# Patient Record
Sex: Female | Born: 1940 | Race: White | Hispanic: No | Marital: Single | Smoking: Never smoker
Health system: Southern US, Community
[De-identification: ages and names within clinical notes are randomized; demographics above are authoritative.]

## PROBLEM LIST (undated history)

## (undated) DIAGNOSIS — R32 Unspecified urinary incontinence: Secondary | ICD-10-CM

## (undated) DIAGNOSIS — I10 Essential (primary) hypertension: Secondary | ICD-10-CM

## (undated) HISTORY — DX: Unspecified urinary incontinence: R32

## (undated) HISTORY — DX: Essential (primary) hypertension: I10

---

## 1972-04-24 HISTORY — PX: ABDOMINAL HYSTERECTOMY: SHX81

## 2005-05-02 ENCOUNTER — Ambulatory Visit: Payer: Self-pay | Admitting: Family Medicine

## 2005-05-26 ENCOUNTER — Ambulatory Visit: Payer: Self-pay | Admitting: Family Medicine

## 2005-12-05 ENCOUNTER — Ambulatory Visit: Payer: Self-pay | Admitting: Nurse Practitioner

## 2006-07-09 ENCOUNTER — Ambulatory Visit: Payer: Self-pay | Admitting: General Surgery

## 2015-08-27 ENCOUNTER — Telehealth: Payer: Self-pay | Admitting: Family Medicine

## 2015-08-27 ENCOUNTER — Ambulatory Visit
Admission: RE | Admit: 2015-08-27 | Discharge: 2015-08-27 | Disposition: A | Discharge status: Home / Self Care | Payer: Medicare Other | Source: Ambulatory Visit | Attending: Family Medicine | Admitting: Family Medicine

## 2015-08-27 ENCOUNTER — Ambulatory Visit (INDEPENDENT_AMBULATORY_CARE_PROVIDER_SITE_OTHER): Payer: Medicare Other | Admitting: Family Medicine

## 2015-08-27 ENCOUNTER — Encounter: Payer: Self-pay | Admitting: Family Medicine

## 2015-08-27 ENCOUNTER — Encounter (INDEPENDENT_AMBULATORY_CARE_PROVIDER_SITE_OTHER): Payer: Self-pay

## 2015-08-27 ENCOUNTER — Emergency Department
Admission: EM | Admit: 2015-08-27 | Discharge: 2015-08-27 | Disposition: A | Discharge status: Home / Self Care | Payer: Medicare Other | Source: Home / Self Care | Attending: Emergency Medicine | Admitting: Emergency Medicine

## 2015-08-27 VITALS — BP 158/82 | HR 70 | Temp 98.1°F | Ht 68.0 in | Wt 381.0 lb

## 2015-08-27 DIAGNOSIS — I82412 Acute embolism and thrombosis of left femoral vein: Secondary | ICD-10-CM | POA: Insufficient documentation

## 2015-08-27 DIAGNOSIS — M7989 Other specified soft tissue disorders: Secondary | ICD-10-CM

## 2015-08-27 DIAGNOSIS — R531 Weakness: Secondary | ICD-10-CM | POA: Diagnosis not present

## 2015-08-27 DIAGNOSIS — I82432 Acute embolism and thrombosis of left popliteal vein: Secondary | ICD-10-CM | POA: Diagnosis not present

## 2015-08-27 DIAGNOSIS — I82402 Acute embolism and thrombosis of unspecified deep veins of left lower extremity: Secondary | ICD-10-CM

## 2015-08-27 DIAGNOSIS — R29898 Other symptoms and signs involving the musculoskeletal system: Secondary | ICD-10-CM

## 2015-08-27 DIAGNOSIS — I10 Essential (primary) hypertension: Secondary | ICD-10-CM | POA: Diagnosis not present

## 2015-08-27 DIAGNOSIS — M4686 Other specified inflammatory spondylopathies, lumbar region: Secondary | ICD-10-CM | POA: Insufficient documentation

## 2015-08-27 DIAGNOSIS — Z86718 Personal history of other venous thrombosis and embolism: Secondary | ICD-10-CM | POA: Insufficient documentation

## 2015-08-27 DIAGNOSIS — M160 Bilateral primary osteoarthritis of hip: Secondary | ICD-10-CM | POA: Insufficient documentation

## 2015-08-27 LAB — COMPREHENSIVE METABOLIC PANEL
ALBUMIN: 3.8 g/dL (ref 3.5–5.2)
ALT: 14 U/L (ref 0–35)
AST: 13 U/L (ref 0–37)
Alkaline Phosphatase: 50 U/L (ref 39–117)
BUN: 15 mg/dL (ref 6–23)
CALCIUM: 9.5 mg/dL (ref 8.4–10.5)
CHLORIDE: 98 meq/L (ref 96–112)
CO2: 30 meq/L (ref 19–32)
Creatinine, Ser: 0.87 mg/dL (ref 0.40–1.20)
GFR: 67.27 mL/min (ref >60.00)
Glucose, Bld: 133 mg/dL — ABNORMAL HIGH (ref 70–99)
POTASSIUM: 4.3 meq/L (ref 3.5–5.1)
Sodium: 138 mEq/L (ref 135–145)
Total Bilirubin: 0.5 mg/dL (ref 0.2–1.2)
Total Protein: 7.3 g/dL (ref 6.0–8.3)

## 2015-08-27 LAB — CBC
HCT: 38.5 % (ref 36.0–46.0)
Hemoglobin: 12.7 g/dL (ref 12.0–15.0)
MCHC: 32.9 g/dL (ref 30.0–36.0)
MCV: 91.3 fl (ref 78.0–100.0)
Platelets: 329 10*3/uL (ref 150.0–400.0)
RBC: 4.21 Mil/uL (ref 3.87–5.11)
RDW: 14.4 % (ref 11.5–15.5)
WBC: 11.2 10*3/uL — AB (ref 4.0–10.5)

## 2015-08-27 LAB — TSH: TSH: 0.94 u[IU]/mL (ref 0.35–4.50)

## 2015-08-27 LAB — HEMOGLOBIN A1C: HEMOGLOBIN A1C: 6.1 % (ref 4.6–6.5)

## 2015-08-27 LAB — SEDIMENTATION RATE: SED RATE: 55 mm/h — AB (ref 0–22)

## 2015-08-27 LAB — VITAMIN B12: VITAMIN B 12: 165 pg/mL — AB (ref 211–911)

## 2015-08-27 MED ORDER — APIXABAN 5 MG PO TABS: 5.0000 mg | ORAL_TABLET | Freq: Two times a day (BID) | ORAL | Status: DC

## 2015-08-27 NOTE — Discharge Instructions (Signed)
Deep Vein Thrombosis °A deep vein thrombosis (DVT) is a blood clot (thrombus) that usually occurs in a deep, larger vein of the lower leg or the pelvis, or in an upper extremity such as the arm. These are dangerous and can lead to serious and even life-threatening complications if the clot travels to the lungs. °A DVT can damage the valves in your leg veins so that instead of flowing upward, the blood pools in the lower leg. This is called post-thrombotic syndrome, and it can result in pain, swelling, discoloration, and sores on the leg. °CAUSES °A DVT is caused by the formation of a blood clot in your leg, pelvis, or arm. Usually, several things contribute to the formation of blood clots. A clot may develop when: °· Your blood flow slows down. °· Your vein becomes damaged in some way. °· You have a condition that makes your blood clot more easily. °RISK FACTORS °A DVT is more likely to develop in: °· People who are older, especially over 60 years of age. °· People who are overweight (obese). °· People who sit or lie still for a long time, such as during long-distance travel (over 4 hours), bed rest, hospitalization, or during recovery from certain medical conditions like a stroke. °· People who do not engage in much physical activity (sedentary lifestyle). °· People who have chronic breathing disorders. °· People who have a personal or family history of blood clots or blood clotting disease. °· People who have peripheral vascular disease (PVD), diabetes, or some types of cancer. °· People who have heart disease, especially if the person had a recent heart attack or has congestive heart failure. °· People who have neurological diseases that affect the legs (leg paresis). °· People who have had a traumatic injury, such as breaking a hip or leg. °· People who have recently had major or lengthy surgery, especially on the hip, knee, or abdomen. °· People who have had a central line placed inside a large vein. °· People  who take medicines that contain the hormone estrogen. These include birth control pills and hormone replacement therapy. °· Pregnancy or during childbirth or the postpartum period. °· Long plane flights (over 8 hours). °SIGNS AND SYMPTOMS °Symptoms of a DVT can include:  °· Swelling of your leg or arm, especially if one side is much worse. °· Warmth and redness of your leg or arm, especially if one side is much worse. °· Pain in your arm or leg. If the clot is in your leg, symptoms may be more noticeable or worse when you stand or walk. °· A feeling of pins and needles, if the clot is in the arm. °The symptoms of a DVT that has traveled to the lungs (pulmonary embolism, PE) usually start suddenly and include: °· Shortness of breath while active or at rest. °· Coughing or coughing up blood or blood-tinged mucus. °· Chest pain that is often worse with deep breaths. °· Rapid or irregular heartbeat. °· Feeling light-headed or dizzy. °· Fainting. °· Feeling anxious. °· Sweating. °There may also be pain and swelling in a leg if that is where the blood clot started. °These symptoms may represent a serious problem that is an emergency. Do not wait to see if the symptoms will go away. Get medical help right away. Call your local emergency services (911 in the [place] Do not drive yourself to the hospital. °DIAGNOSIS °Your health care provider will take a medical history and perform a physical exam. You may also   have other tests, including: °· Blood tests to assess the clotting properties of your blood. °· Imaging tests, such as CT, ultrasound, MRI, X-ray, and other tests to see if you have clots anywhere in your body. °TREATMENT °After a DVT is identified, it can be treated. The type of treatment that you receive depends on many factors, such as the cause of your DVT, your risk for bleeding or developing more clots, and other medical conditions that you have. Sometimes, a combination of treatments is necessary. Treatment  options may be combined and include: °· Monitoring the blood clot with ultrasound. °· Taking medicines by mouth, such as newer blood thinners (anticoagulants), thrombolytics, or warfarin. °· Taking anticoagulant medicine by injection or through an IV tube. °· Wearing compression stockings or using different types of devices. °· Surgery (rare) to remove the blood clot or to place a filter in your abdomen to stop the blood clot from traveling to your lungs. °Treatments for a DVT are often divided into immediate treatment and long-term treatment (up to 3 months after DVT). You can work with your health care provider to choose the treatment program that is best for you. °HOME CARE INSTRUCTIONS °If you are taking a newer oral anticoagulant: °· Take the medicine every single day at the same time each day. °· Understand what foods and drugs interact with this medicine. °· Understand that there are no regular blood tests required when using this medicine. °· Understand the side effects of this medicine, including excessive bruising or bleeding. Ask your health care provider or pharmacist about other possible side effects. °If you are taking warfarin: °· Understand how to take warfarin and know which foods can affect how warfarin works in your body. °· Understand that it is dangerous to take too much or too little warfarin. Too much warfarin increases the risk of bleeding. Too little warfarin continues to allow the risk for blood clots. °· Follow your PT and INR blood testing schedule. The PT and INR results allow your health care provider to adjust your dose of warfarin. It is very important that you have your PT and INR tested as often as told by your health care provider. °· Avoid major changes in your diet, or tell your health care provider before you change your diet. Arrange a visit with a registered dietitian to answer your questions. Many foods, especially foods that are high in vitamin K, can interfere with warfarin  and affect the PT and INR results. Eat a consistent amount of foods that are high in vitamin K, such as: °¨ Spinach, kale, broccoli, cabbage, collard greens, turnip greens, Brussels sprouts, peas, cauliflower, seaweed, and parsley. °¨ Beef liver and pork liver. °¨ Green tea. °¨ Soybean oil. °· Tell your health care provider about any and all medicines, vitamins, and supplements that you take, including aspirin and other over-the-counter anti-inflammatory medicines. Be especially cautious with aspirin and anti-inflammatory medicines. Do not take those before you ask your health care provider if it is safe to do so. This is important because many medicines can interfere with warfarin and affect the PT and INR results. °· Do not start or stop taking any over-the-counter or prescription medicine unless your health care provider or pharmacist tells you to do so. °If you take warfarin, you will also need to do these things: °· Hold pressure over cuts for longer than usual. °· Tell your dentist and other health care providers that you are taking warfarin before you have any procedures in which   bleeding may occur. °· Avoid alcohol or drink very small amounts. Tell your health care provider if you change your alcohol intake. °· Do not use tobacco products, including cigarettes, chewing tobacco, and e-cigarettes. If you need help quitting, ask your health care provider. °· Avoid contact sports. °General Instructions °· Take over-the-counter and prescription medicines only as told by your health care provider. Anticoagulant medicines can have side effects, including easy bruising and difficulty stopping bleeding. If you are prescribed an anticoagulant, you will also need to do these things: °¨ Hold pressure over cuts for longer than usual. °¨ Tell your dentist and other health care providers that you are taking anticoagulants before you have any procedures in which bleeding may occur. °¨ Avoid contact sports. °· Wear a medical  alert bracelet or carry a medical alert card that says you have had a PE. °· Ask your health care provider how soon you can go back to your normal activities. Stay active to prevent new blood clots from forming. °· Make sure to exercise while traveling or when you have been sitting or standing for a long period of time. It is very important to exercise. Exercise your legs by walking or by tightening and relaxing your leg muscles often. Take frequent walks. °· Wear compression stockings as told by your health care provider to help prevent more blood clots from forming. °· Do not use tobacco products, including cigarettes, chewing tobacco, and e-cigarettes. If you need help quitting, ask your health care provider. °· Keep all follow-up appointments with your health care provider. This is important. °PREVENTION °Take these actions to decrease your risk of developing another DVT: °· Exercise regularly. For at least 30 minutes every day, engage in: °¨ Activity that involves moving your arms and legs. °¨ Activity that encourages good blood flow through your body by increasing your heart rate. °· Exercise your arms and legs every hour during long-distance travel (over 4 hours). Drink plenty of water and avoid drinking alcohol while traveling. °· Avoid sitting or lying in bed for long periods of time without moving your legs. °· Maintain a weight that is appropriate for your height. Ask your health care provider what weight is healthy for you. °· If you are a woman who is over 35 years of age, avoid unnecessary use of medicines that contain estrogen. These include birth control pills. °· Do not smoke, especially if you take estrogen medicines. If you need help quitting, ask your health care provider. °If you are hospitalized, prevention measures may include: °· Early walking after surgery, as soon as your health care provider says that it is safe. °· Receiving anticoagulants to prevent blood clots. If you cannot take  anticoagulants, other options may be available, such as wearing compression stockings or using different types of devices. °SEEK IMMEDIATE MEDICAL CARE IF: °· You have new or increased pain, swelling, or redness in an arm or leg. °· You have numbness or tingling in an arm or leg. °· You have shortness of breath while active or at rest. °· You have chest pain. °· You have a rapid or irregular heartbeat. °· You feel light-headed or dizzy. °· You cough up blood. °· You notice blood in your vomit, bowel movement, or urine. °These symptoms may represent a serious problem that is an emergency. Do not wait to see if the symptoms will go away. Get medical help right away. Call your local emergency services (911 in the [place] Do not drive yourself to the hospital. °  °  This information is not intended to replace advice given to you by your health care provider. Make sure you discuss any questions you have with your health care provider. °  °Document Released: 04/10/2005 Document Revised: 12/30/2014 Document Reviewed: 08/05/2014 °[employer]vier Interactive Patient Education ©2016 [employer] ° °

## 2015-08-27 NOTE — Telephone Encounter (Signed)
Just received after hours call from the service- regarding a venous doppler on this patient - positive for DVT femoral and popliteal veins  She is symptomatic  Adv she go to [facility] ED (she is already there) for further eval and tx Will cc her PCP

## 2015-08-27 NOTE — Assessment & Plan Note (Signed)
Patient reports subjective weakness in her legs. Difficulty walking though no other difficulty moving her legs. She is neurologically intact. No red flags. Suspect some level of deconditioning as she has been more inactive over the last several years. We'll obtain an x-ray of her lumbar spine to evaluate for pathology there. We'll obtain lab work as well to evaluate further. We will place a home PT referral. Given return precautions.

## 2015-08-27 NOTE — Assessment & Plan Note (Addendum)
Unilateral left leg swelling. Suspect venous insufficiency though with discrepancy in sizes between right and left legs will order an ultrasound of her left lower extremity to evaluate for DVT. If this reveals a DVT would favor Xarelto if her renal function will tolerate. CMP was checked today. Given return precautions.

## 2015-08-27 NOTE — Progress Notes (Signed)
Pre visit review using our clinic review tool, if applicable. No additional management support is needed unless otherwise documented below in the visit note. 

## 2015-08-27 NOTE — ED Provider Notes (Signed)
[facility] Emergency Department Provider Note  ____________________________________________    I have reviewed the triage vital signs and the nursing notes.   HISTORY  Chief Complaint DVT    HPI Kathleen Barton is a 75 y.o. female who was sent to the emergency department by her PCP for a confirmed DVT in the left leg. Patient reportedly noticed some increased swelling in her left leg over the last week. She admits she has a rather sedentary lifestyle. No injury to the area. Patient denies any shortness of breath. No pleurisy. No chest pain. Ultrasound confirmed DVT in left leg     Past Medical History  Diagnosis Date  . Hypertension   . Urinary incontinence     Patient Active Problem List   Diagnosis Date Noted  . Swelling of left lower extremity 08/27/2015  . Weakness of both lower extremities 08/27/2015  . Essential hypertension 08/27/2015    Past Surgical History  Procedure Laterality Date  . Abdominal hysterectomy  1974    Current Outpatient Rx  Name  Route  Sig  Dispense  Refill  . apixaban (ELIQUIS) 5 MG TABS tablet   Oral   Take 1 tablet (5 mg total) by mouth 2 (two) times daily. Take 10 mg (2 tablets) BID for 7 days,THEN 5 MG BID   84 tablet   0   . atenolol-chlorthalidone (TENORETIC) 50-25 MG tablet   Oral   Take 1 tablet by mouth daily. for blood pressure      6   . cholecalciferol (VITAMIN D) 1000 units tablet   Oral   Take 1,000 Units by mouth daily.         . meclizine (ANTIVERT) 25 MG tablet   Oral   Take 25 mg by mouth 3 (three) times daily as needed for dizziness.         Marland Kitchen PARoxetine (PAXIL) 30 MG tablet   Oral   Take 30 mg by mouth daily.      6     Allergies Review of patient's allergies indicates no known allergies.  Family History  Problem Relation Age of Onset  . Heart disease    . Hypertension      Social History Social History  Substance Use Topics  . Smoking status: Never Smoker   .  Smokeless tobacco: None  . Alcohol Use: No    Review of Systems  Constitutional: Negative for fever.   Cardiovascular: Negative for chest pain Respiratory: Negative for shortness of breath.No pleurisy Gastrointestinal: Negative for abdominal pain  Musculoskeletal: Positive for left calf pain Skin: Negative for rash. Neurological: Negative for headache Psychiatric: no anxiety    ____________________________________________   PHYSICAL EXAM:  VITAL SIGNS: ED Triage Vitals  Enc Vitals Group     BP 08/27/15 1758 162/58 mmHg     Pulse Rate 08/27/15 1758 84     Resp 08/27/15 1758 20     Temp 08/27/15 1758 98.2 F (36.8 C)     Temp Source 08/27/15 1758 Oral     SpO2 08/27/15 1758 92 %     Weight 08/27/15 1758 381 lb (172.82 kg)     Height 08/27/15 1758  (1.727 m)     Head Cir --      Peak Flow --      Pain Score 08/27/15 1759 0     Pain Loc --      Pain Edu? --      Excl. in GC? --  Constitutional: Alert and oriented. Well appearing and in no distress. Pleasant and interactive Eyes: Conjunctivae are normal. No erythema or injection ENT   Head: Normocephalic and atraumatic.   Mouth/Throat: Mucous membranes are moist. Cardiovascular: Normal rate, regular rhythm. Normal and symmetric distal pulses are present in the upper extremities. Respiratory: Normal respiratory effort without tachypnea nor retractions. Breath sounds are clear and equal bilaterally.   Genitourinary: deferred Musculoskeletal: Mild swelling to the left leg, no significant erythema. 2+ distal pulses. Neurologic:  Normal speech and language. No gross focal neurologic deficits are appreciated. Skin:  Skin is warm, dry and intact. No rash noted. Psychiatric: Mood and affect are normal. Patient exhibits appropriate insight and judgment.  ____________________________________________    LABS (pertinent positives/negatives)  Labs Reviewed - No data to  display  ____________________________________________   EKG  None  ____________________________________________    RADIOLOGY  None  ____________________________________________   PROCEDURES  Procedure(s) performed: none  Critical Care performed: none  ____________________________________________   INITIAL IMPRESSION / ASSESSMENT AND PLAN / ED COURSE  Pertinent labs & imaging results that were available during my care of the patient were reviewed by me and considered in my medical decision making (see chart for details).  Reviewed ultrasound result, positive left DVT. Patient without new shortness of breath or pleurisy. No hypoxia, monitor picking up erroneous readings due to patient movement, 99% while I was in the room holding her hand still. Discussed doing CT angio of the chest either in the emergency department or as an outpatient. Patient is impatient to leave and would prefer to do it as an outpatient if needed and given that the treatment would be the same I feel it is reasonable to discharge without CT.  I'll prescribe the patient eliquis. Discussed side effects at some length with the patient. She will follow-up with her PCP ____________________________________________   FINAL CLINICAL IMPRESSION(S) / ED DIAGNOSES  Final diagnoses:  DVT (deep venous thrombosis), left          Jene Everyobert Kellar Westberg, MD 08/27/15 2050

## 2015-08-27 NOTE — Telephone Encounter (Signed)
Called ultrasound to speak with technologist. Patient's ultrasound revealed DVT. Fairly extensive on imaging. Patient's labs have not returned. Ultrasound technologist for reports that she has spoken to the on-call physician and they recommended ED evaluation. I would agree with this given lack of resulted lab work and given patient's comorbidities. Discussed this with the patient's daughter-in-law. She is in agreement. Ultrasound technologist will take the patient to the emergency room.

## 2015-08-27 NOTE — ED Notes (Signed)
Pt sent MD [place] w/ confirmed DVT in L leg.  Pt noticed incr swelling in leg over last week.  Pt denies pain in leg.

## 2015-08-27 NOTE — Progress Notes (Addendum)
Patient ID: Kathleen Barton, female   DOB: Oct 31, 1940, 75 y.o.   MRN: [id number]  Tommi Rumps, MD Phone: [phone]  Kathleen Barton is a 75 y.o. female who presents today for new patient visit.  Patient presents with her daughter-in-law. Both of them contribute some of the history.  Leg weakness: Patient notes progressively over the last several months she's had issues with walking. She notes she has progressively gotten weaker. Worsening over the last several weeks. No numbness in her legs. She is able to move them while sitting. They've got the point where she can stand and pivot though is unable to do anything else. No back pain. No incontinence. No fevers. No saddle anesthesia. No history of cancer. Note she has not been very physically active over the last several years. Does not attempt to move around very frequently.  Left leg swelling: This has been going on for the last week or so. Left calf and lower leg swollen compared to the right. No pain. Her mobility is much less than previously. No history of blood clot.  HYPERTENSION Disease Monitoring Home BP Monitoring not checking recently Chest pain- no    Dyspnea- no Medications Compliance-  taking atenolol and chlorthalidone. Lightheadedness-  no  Edema- no   Active Ambulatory Problems    Diagnosis Date Noted  . Swelling of left lower extremity 08/27/2015  . Weakness of both lower extremities 08/27/2015  . Essential hypertension 08/27/2015   Resolved Ambulatory Problems    Diagnosis Date Noted  . No Resolved Ambulatory Problems   Past Medical History  Diagnosis Date  . Hypertension   . Urinary incontinence     Family History  Problem Relation Age of Onset  . Heart disease    . Hypertension      Social History   Social History  . Marital Status: Single    Spouse Name: N/A  . Number of Children: N/A  . Years of Education: N/A   Occupational History  . Not on file.   Social History Main Topics  . Smoking  status: Never Smoker   . Smokeless tobacco: Not on file  . Alcohol Use: No  . Drug Use: No  . Sexual Activity: Not on file   Other Topics Concern  . Not on file   Social History Narrative  . No narrative on file    ROS  General:  Negative for nexplained weight loss, fever Skin: Negative for new or changing mole, sore that won't heal HEENT: Negative for trouble hearing, trouble seeing, ringing in ears, mouth sores, hoarseness, change in voice, dysphagia. CV:  Negative for chest pain, dyspnea, edema, palpitations Resp: Negative for cough, dyspnea, hemoptysis GI: Negative for nausea, vomiting, diarrhea, constipation, abdominal pain, melena, hematochezia. GU: Negative for dysuria, incontinence, urinary hesitance, hematuria, vaginal or penile discharge, polyuria, sexual difficulty, lumps in testicle or breasts MSK: Negative for muscle cramps or aches, joint pain or swelling Neuro: Positive for weakness, Negative for headaches, numbness, dizziness, passing out/fainting Psych: Negative for depression, anxiety, memory problems  Objective  Physical Exam Filed Vitals:   08/27/15 1418  BP: 158/82  Pulse: 70  Temp: 98.1 F (36.7 C)    BP Readings from Last 3 Encounters:  08/27/15 158/82   Wt Readings from Last 3 Encounters:  08/27/15 381 lb (172.82 kg)    Physical Exam  Constitutional: No distress.  HENT:  Head: Normocephalic and atraumatic.  Right Ear: External ear normal.  Left Ear: External ear normal.  Mouth/Throat: Oropharynx  is clear and moist. No oropharyngeal exudate.  Eyes: Conjunctivae are normal. Pupils are equal, round, and reactive to light.  Cardiovascular: Normal rate, regular rhythm and normal heart sounds.   Pulmonary/Chest: Effort normal and breath sounds normal.  Abdominal: Soft. She exhibits no distension. There is no tenderness.  Musculoskeletal:  Left calf 50 cm, right calf 44 cm, 1+ pitting edema left lower extremity, no edema right lower extremity    Neurological: She is alert.  CN 2-12 intact, 5/5 strength in bilateral biceps, triceps, grip, quads, hamstrings, plantar and dorsiflexion, sensation to light touch intact in bilateral UE and LE, 2+ patellar reflexes  Skin: Skin is warm and dry. She is not diaphoretic.     Assessment/Plan:   Weakness of both lower extremities Patient reports subjective weakness in her legs. Difficulty walking though no other difficulty moving her legs. She is neurologically intact. No red flags. Suspect some level of deconditioning as she has been more inactive over the last several years. We'll obtain an x-ray of her lumbar spine to evaluate for pathology there. We'll obtain lab work as well to evaluate further. We will place a home PT referral. Given return precautions.  Swelling of left lower extremity Unilateral left leg swelling. Suspect venous insufficiency though with discrepancy in sizes between right and left legs will order an ultrasound of her left lower extremity to evaluate for DVT. If this reveals a DVT would favor Xarelto if her renal function will tolerate. CMP was checked today. Given return precautions.  Essential hypertension Elevated today. Patient and daughter-in-law report that it is typically elevated when she comes to the doctor's office and were normal at home. Discussed options of adding an additional blood pressure medication or monitoring and checking at home on a daily basis. They opted to monitor and check at home. They will check for the next week. If persistently greater than 150/90 we will consider additional medication.    Orders Placed This Encounter  Procedures  . US Venous Img Lower Unilateral Left    HOLD PATIENT WITH RESULTS    Standing Status: Future     Number of Occurrences: 1     Standing Expiration Date: 10/26/2016    Order Specific Question:  Reason for Exam (SYMPTOM  OR DIAGNOSIS REQUIRED)    Answer:  left lower extremity swelling for past week, 6 cm discrepancy  between legs    Order Specific Question:  Preferred imaging location?    Answer:  [facility]    Order Specific Question:  Call Results- Best Contact Number?    Answer:  [phone]  . DG Lumbar Spine Complete    Standing Status: Future     Number of Occurrences: 1     Standing Expiration Date: 10/26/2016    Order Specific Question:  Reason for Exam (SYMPTOM  OR DIAGNOSIS REQUIRED)    Answer:  subjective lower extermity weakness    Order Specific Question:  Preferred imaging location?    Answer:  [facility] ([facility])  . TSH  . Sed Rate (ESR)  . HgB A1c  . CBC  . B12  . Ambulatory referral to Home Health    Referral Priority:  Routine    Referral Type:  Home Health Care    Referral Reason:  Specialty Services Required    Requested Specialty:  [place]    Number of Visits Requested:  1    Meds ordered this encounter  Medications  . atenolol-chlorthalidone (TENORETIC) 50-25 MG  tablet    Sig: Take 1 tablet by mouth daily. for blood pressure    Refill:  6  . PARoxetine (PAXIL) 30 MG tablet    Sig: Take 30 mg by mouth daily.    Refill:  6  . meclizine (ANTIVERT) 25 MG tablet    Sig: Take 25 mg by mouth 3 (three) times daily as needed for dizziness.  . cholecalciferol (VITAMIN D) 1000 units tablet    Sig: Take 1,000 Units by mouth daily.     Tommi Rumps, MD [place]

## 2015-08-27 NOTE — Patient Instructions (Addendum)
Nice to meet you. Please [facility] the [facility] get the ultrasound of her leg. We'll obtain some lab work as well. You can also get an x-ray of your low back. We will refer you for physical therapy at home. Please monitor your blood pressure daily at home. If persistently greater than 150/90 please let us know. Please call to report her blood pressures in 1 week. If you develop numbness, weakness, loss of bowel or bladder function, chest pain, shortness of breath, or any new or changing symptoms please seek medical attention.

## 2015-08-27 NOTE — Assessment & Plan Note (Signed)
Elevated today. Patient and daughter-in-law report that it is typically elevated when she comes to the doctor's office and were normal at home. Discussed options of adding an additional blood pressure medication or monitoring and checking at home on a daily basis. They opted to monitor and check at home. They will check for the next week. If persistently greater than 150/90 we will consider additional medication.

## 2015-09-01 ENCOUNTER — Telehealth: Payer: Self-pay | Admitting: Family Medicine

## 2015-09-01 NOTE — Telephone Encounter (Signed)
[facility] called to request verbal order for the addition of OT and excercise of left leg. Gave ok

## 2015-09-08 ENCOUNTER — Telehealth: Payer: Self-pay | Admitting: Family Medicine

## 2015-09-08 NOTE — Telephone Encounter (Signed)
Please advise if you are okay with this, thanks

## 2015-09-08 NOTE — Telephone Encounter (Signed)
The patient's daughter called asking for the B-12 to be sent to the pharmacy and she would give the patient her injections. Her daughter is a Engineer, civil (consulting)nurse.

## 2015-09-09 ENCOUNTER — Telehealth: Payer: Self-pay | Admitting: *Deleted

## 2015-09-09 MED ORDER — "SYRINGE/NEEDLE (DISP) 25G X 1"" 3 ML MISC": Status: DC

## 2015-09-09 MED ORDER — CYANOCOBALAMIN 1000 MCG/ML IJ SOLN: 1000.0000 ug | INTRAMUSCULAR | Status: DC

## 2015-09-09 NOTE — Telephone Encounter (Signed)
[facility] has requested to have home health aid services 3x a week Contact Ester [phone]

## 2015-09-09 NOTE — Telephone Encounter (Signed)
Sent prescriptions in . thanks

## 2015-09-09 NOTE — Telephone Encounter (Signed)
This would be ok with me 

## 2015-09-09 NOTE — Telephone Encounter (Signed)
Attempted to call Ester back, not able to leave a message.

## 2015-09-14 ENCOUNTER — Telehealth: Payer: Self-pay | Admitting: *Deleted

## 2015-09-14 NOTE — Telephone Encounter (Signed)
FYI Kathleen Barton from [facility] call to report that patient fell from her recliner on 09/10/15. She slipped on the plastic chair cover, she did not have any injuries. [facility] corrected the cause of the fall.

## 2015-09-14 NOTE — Telephone Encounter (Signed)
Okay to give verbal order.

## 2015-09-14 NOTE — Telephone Encounter (Signed)
Kathleen [facility] from [facility], she is needing a home health aid verbal order for 3 times a week, a verbal was given by [employer]

## 2015-09-15 NOTE — Telephone Encounter (Signed)
Noted. Please check in with the patient to ensure she is doing well after this fall. We will discuss at her next follow-up as long as she is feeling well.

## 2015-09-16 NOTE — Telephone Encounter (Signed)
Spoke to patient and she is "fine".  She is able to continue with her exercises.

## 2015-09-17 ENCOUNTER — Telehealth: Payer: Self-pay | Admitting: *Deleted

## 2015-09-17 NOTE — Telephone Encounter (Signed)
Trey PaulaJeff from [facility] has requested verbal orders for Physical therapy: 2x a week for 3 weeks.  [phone]

## 2015-09-17 NOTE — Telephone Encounter (Signed)
Please advise for verbal order, thanks

## 2015-09-19 NOTE — Telephone Encounter (Signed)
Verbal order can be given.  

## 2015-09-21 ENCOUNTER — Other Ambulatory Visit: Payer: Self-pay | Admitting: Family Medicine

## 2015-09-21 DIAGNOSIS — R29898 Other symptoms and signs involving the musculoskeletal system: Secondary | ICD-10-CM

## 2015-09-21 NOTE — Telephone Encounter (Signed)
Spoke with Trey PaulaJeff and gave verbal order.

## 2015-09-22 ENCOUNTER — Other Ambulatory Visit: Payer: Self-pay | Admitting: Family Medicine

## 2015-09-22 MED ORDER — APIXABAN 5 MG PO TABS: 5.0000 mg | ORAL_TABLET | Freq: Two times a day (BID) | ORAL | Status: DC

## 2015-09-22 NOTE — Telephone Encounter (Signed)
Pt will run out of her apixaban (ELIQUIS) 5 MG TABS tablet before her appt on 10/08/15. Can she have a refill? Please call Amy Lennice Sitesope, daughter-in-law.

## 2015-09-22 NOTE — Telephone Encounter (Signed)
Please advise for refill has appt on 6/16. thanks

## 2015-09-22 NOTE — Telephone Encounter (Signed)
Refill given

## 2015-09-23 ENCOUNTER — Telehealth: Payer: Self-pay | Admitting: Family Medicine

## 2015-09-23 NOTE — Telephone Encounter (Signed)
Pt called stating that a PA is needed for medication apixaban (ELIQUIS) 5 MG TABS tablet. And refill.   Pt does have a scheduled appt.  Pharmacy [place] [facility] [place] [address]   Call pt @ [phone]. Thank you!

## 2015-09-23 NOTE — Telephone Encounter (Signed)
Looks like med was refilled yesterday, I have not received information that PA was needed.

## 2015-09-24 DIAGNOSIS — Z7901 Long term (current) use of anticoagulants: Secondary | ICD-10-CM | POA: Diagnosis not present

## 2015-09-24 DIAGNOSIS — R531 Weakness: Secondary | ICD-10-CM | POA: Diagnosis not present

## 2015-09-24 DIAGNOSIS — I10 Essential (primary) hypertension: Secondary | ICD-10-CM | POA: Diagnosis not present

## 2015-09-24 DIAGNOSIS — M7989 Other specified soft tissue disorders: Secondary | ICD-10-CM | POA: Diagnosis not present

## 2015-09-24 DIAGNOSIS — R32 Unspecified urinary incontinence: Secondary | ICD-10-CM | POA: Diagnosis not present

## 2015-09-24 NOTE — Telephone Encounter (Signed)
PA completed on Cover my meds , approved.  Spoke with the pharmacy and the patient to let them know it was approved. Thanks

## 2015-09-24 NOTE — Telephone Encounter (Signed)
Patient stated that apixaban will need a prior authorization. The pharmacy has the Rx, however they can not fill it without the prior authorization

## 2015-09-24 NOTE — Telephone Encounter (Signed)
Patient stated that the pharmacy has not receive Rx refill.

## 2015-09-28 DIAGNOSIS — M7989 Other specified soft tissue disorders: Secondary | ICD-10-CM | POA: Diagnosis not present

## 2015-09-28 DIAGNOSIS — Z7901 Long term (current) use of anticoagulants: Secondary | ICD-10-CM | POA: Diagnosis not present

## 2015-09-28 DIAGNOSIS — R531 Weakness: Secondary | ICD-10-CM | POA: Diagnosis not present

## 2015-09-28 DIAGNOSIS — R32 Unspecified urinary incontinence: Secondary | ICD-10-CM | POA: Diagnosis not present

## 2015-09-28 DIAGNOSIS — I10 Essential (primary) hypertension: Secondary | ICD-10-CM | POA: Diagnosis not present

## 2015-09-29 DIAGNOSIS — R531 Weakness: Secondary | ICD-10-CM | POA: Diagnosis not present

## 2015-09-29 DIAGNOSIS — Z7901 Long term (current) use of anticoagulants: Secondary | ICD-10-CM | POA: Diagnosis not present

## 2015-09-29 DIAGNOSIS — M7989 Other specified soft tissue disorders: Secondary | ICD-10-CM | POA: Diagnosis not present

## 2015-09-29 DIAGNOSIS — R32 Unspecified urinary incontinence: Secondary | ICD-10-CM | POA: Diagnosis not present

## 2015-09-29 DIAGNOSIS — I10 Essential (primary) hypertension: Secondary | ICD-10-CM | POA: Diagnosis not present

## 2015-09-30 DIAGNOSIS — I10 Essential (primary) hypertension: Secondary | ICD-10-CM | POA: Diagnosis not present

## 2015-09-30 DIAGNOSIS — Z7901 Long term (current) use of anticoagulants: Secondary | ICD-10-CM | POA: Diagnosis not present

## 2015-09-30 DIAGNOSIS — M7989 Other specified soft tissue disorders: Secondary | ICD-10-CM | POA: Diagnosis not present

## 2015-09-30 DIAGNOSIS — R32 Unspecified urinary incontinence: Secondary | ICD-10-CM | POA: Diagnosis not present

## 2015-09-30 DIAGNOSIS — R531 Weakness: Secondary | ICD-10-CM | POA: Diagnosis not present

## 2015-10-01 DIAGNOSIS — I10 Essential (primary) hypertension: Secondary | ICD-10-CM | POA: Diagnosis not present

## 2015-10-01 DIAGNOSIS — M7989 Other specified soft tissue disorders: Secondary | ICD-10-CM | POA: Diagnosis not present

## 2015-10-01 DIAGNOSIS — R531 Weakness: Secondary | ICD-10-CM | POA: Diagnosis not present

## 2015-10-01 DIAGNOSIS — Z7901 Long term (current) use of anticoagulants: Secondary | ICD-10-CM | POA: Diagnosis not present

## 2015-10-01 DIAGNOSIS — R32 Unspecified urinary incontinence: Secondary | ICD-10-CM | POA: Diagnosis not present

## 2015-10-05 DIAGNOSIS — I10 Essential (primary) hypertension: Secondary | ICD-10-CM | POA: Diagnosis not present

## 2015-10-05 DIAGNOSIS — R32 Unspecified urinary incontinence: Secondary | ICD-10-CM | POA: Diagnosis not present

## 2015-10-05 DIAGNOSIS — Z7901 Long term (current) use of anticoagulants: Secondary | ICD-10-CM | POA: Diagnosis not present

## 2015-10-05 DIAGNOSIS — M7989 Other specified soft tissue disorders: Secondary | ICD-10-CM | POA: Diagnosis not present

## 2015-10-05 DIAGNOSIS — R531 Weakness: Secondary | ICD-10-CM | POA: Diagnosis not present

## 2015-10-06 DIAGNOSIS — M7989 Other specified soft tissue disorders: Secondary | ICD-10-CM | POA: Diagnosis not present

## 2015-10-06 DIAGNOSIS — Z7901 Long term (current) use of anticoagulants: Secondary | ICD-10-CM | POA: Diagnosis not present

## 2015-10-06 DIAGNOSIS — R531 Weakness: Secondary | ICD-10-CM | POA: Diagnosis not present

## 2015-10-06 DIAGNOSIS — R32 Unspecified urinary incontinence: Secondary | ICD-10-CM | POA: Diagnosis not present

## 2015-10-06 DIAGNOSIS — I10 Essential (primary) hypertension: Secondary | ICD-10-CM | POA: Diagnosis not present

## 2015-10-07 ENCOUNTER — Telehealth: Payer: Self-pay | Admitting: *Deleted

## 2015-10-07 DIAGNOSIS — M7989 Other specified soft tissue disorders: Secondary | ICD-10-CM | POA: Diagnosis not present

## 2015-10-07 DIAGNOSIS — I10 Essential (primary) hypertension: Secondary | ICD-10-CM | POA: Diagnosis not present

## 2015-10-07 DIAGNOSIS — R531 Weakness: Secondary | ICD-10-CM | POA: Diagnosis not present

## 2015-10-07 DIAGNOSIS — Z7901 Long term (current) use of anticoagulants: Secondary | ICD-10-CM | POA: Diagnosis not present

## 2015-10-07 DIAGNOSIS — R32 Unspecified urinary incontinence: Secondary | ICD-10-CM | POA: Diagnosis not present

## 2015-10-07 NOTE — Telephone Encounter (Signed)
Kathleen Barton for [facility] has requested to have 4 more visits for plan of care. Darral Dashsther [phone] for verbal order

## 2015-10-07 NOTE — Telephone Encounter (Signed)
Spoke with Darral DashEsther and gave orders.

## 2015-10-08 ENCOUNTER — Ambulatory Visit: Payer: Medicare Other | Admitting: Family Medicine

## 2015-10-08 DIAGNOSIS — R32 Unspecified urinary incontinence: Secondary | ICD-10-CM | POA: Diagnosis not present

## 2015-10-08 DIAGNOSIS — I10 Essential (primary) hypertension: Secondary | ICD-10-CM | POA: Diagnosis not present

## 2015-10-08 DIAGNOSIS — Z7901 Long term (current) use of anticoagulants: Secondary | ICD-10-CM | POA: Diagnosis not present

## 2015-10-08 DIAGNOSIS — M7989 Other specified soft tissue disorders: Secondary | ICD-10-CM | POA: Diagnosis not present

## 2015-10-08 DIAGNOSIS — R531 Weakness: Secondary | ICD-10-CM | POA: Diagnosis not present

## 2015-10-11 DIAGNOSIS — R32 Unspecified urinary incontinence: Secondary | ICD-10-CM | POA: Diagnosis not present

## 2015-10-11 DIAGNOSIS — I10 Essential (primary) hypertension: Secondary | ICD-10-CM | POA: Diagnosis not present

## 2015-10-11 DIAGNOSIS — Z7901 Long term (current) use of anticoagulants: Secondary | ICD-10-CM | POA: Diagnosis not present

## 2015-10-11 DIAGNOSIS — R531 Weakness: Secondary | ICD-10-CM | POA: Diagnosis not present

## 2015-10-11 DIAGNOSIS — M7989 Other specified soft tissue disorders: Secondary | ICD-10-CM | POA: Diagnosis not present

## 2015-10-14 ENCOUNTER — Encounter: Payer: Self-pay | Admitting: Family Medicine

## 2015-10-14 ENCOUNTER — Ambulatory Visit (INDEPENDENT_AMBULATORY_CARE_PROVIDER_SITE_OTHER): Payer: Medicare Other | Admitting: Family Medicine

## 2015-10-14 VITALS — BP 118/72 | HR 69 | Temp 97.9°F | Ht 68.0 in

## 2015-10-14 DIAGNOSIS — E538 Deficiency of other specified B group vitamins: Secondary | ICD-10-CM | POA: Diagnosis not present

## 2015-10-14 DIAGNOSIS — R29898 Other symptoms and signs involving the musculoskeletal system: Secondary | ICD-10-CM | POA: Diagnosis not present

## 2015-10-14 DIAGNOSIS — I82412 Acute embolism and thrombosis of left femoral vein: Secondary | ICD-10-CM | POA: Diagnosis not present

## 2015-10-14 DIAGNOSIS — I10 Essential (primary) hypertension: Secondary | ICD-10-CM | POA: Diagnosis not present

## 2015-10-14 NOTE — Assessment & Plan Note (Signed)
At goal on recheck. Continue current medications. 

## 2015-10-14 NOTE — Assessment & Plan Note (Signed)
Patient reports completing 2 cycles of B12 injections at home. 2 more to be done and then B12 to be rechecked.

## 2015-10-14 NOTE — Patient Instructions (Signed)
Nice to see you. Please continue your Eliquis. Please continue your blood pressure medications. Please continue to do the physical therapy exercises at home. If you develop chest pain, shortness of breath, worsening depression, thoughts of harming herself or others, or any new or changing symptoms please seek medical attention.

## 2015-10-14 NOTE — Assessment & Plan Note (Signed)
Patient is significantly improved with regards this. She's been getting up and walking with physical therapy and home health. Walking with a walker now. She'll continue physical therapy exercises and continue to monitor.

## 2015-10-14 NOTE — Progress Notes (Signed)
Patient ID: Kathleen Barton, female   DOB: 02/17/1941, 75 y.o.   MRN: [id number]  Kathleen AlarEric Zarai Orsborn, MD Phone: [phone]  Kathleen Barton is a 75 y.o. female who presents today for follow-up.  DVT: Patient diagnosed with left lower extremity DVT at her last office visit. Started on Eliquis in the emergency room. She denies bleeding. She is taking Eliquis twice daily. No chest pain or shortness of breath. Swelling in her leg is significantly improved.  HYPERTENSION Disease Monitoring Home BP Monitoring checking with home health, typically 130s over 70s Chest pain- no    Dyspnea- no Medications Compliance-  taking atenolol and chlorthalidone.  Edema- no  Patient notes she is getting a bit better with regards to her lower extremity strength. She's begun walking with a walker. She has worked with home health physical therapy as well. She notes she is going to move beside her son and daughter-in-law to be closer to them. Got up and washed her own dishes today.  Depression: Notes she's been on Paxil for years. Feels depressed sometimes. Not at this moment. No SI.  PMH: nonsmoker.   ROS see history of present illness  Objective  Physical Exam Filed Vitals:   10/14/15 1438  BP: 118/72  Pulse: 69  Temp: 97.9 F (36.6 C)    BP Readings from Last 3 Encounters:  10/14/15 118/72  08/27/15 166/65  08/27/15 158/82   Wt Readings from Last 3 Encounters:  08/27/15 381 lb (172.82 kg)  08/27/15 381 lb (172.82 kg)    Physical Exam  Constitutional: No distress.  HENT:  Head: Normocephalic and atraumatic.  Cardiovascular: Normal rate, regular rhythm and normal heart sounds.   Pulmonary/Chest: Effort normal and breath sounds normal.  Musculoskeletal:  Bilateral lower extremities with no edema or swelling, no calf tenderness or cords  Neurological: She is alert.  5/5 strength in bilateral quads, hamstrings, plantar and dorsiflexion, sensation to light touch intact in bilateral UE and LE    Skin: Skin is warm and dry. She is not diaphoretic.  Psychiatric: Mood and affect normal.     Assessment/Plan: Please see individual problem list.  Essential hypertension At goal on recheck. Continue current medications.  DVT (deep venous thrombosis) (HCC) Patient with left lower extremity DVT currently on Eliquis. Tolerating medication. No chest pain or shortness of breath. Swelling is resolved. We'll continue Eliquis at this time. Event was likely related to immobility and level of mobility is unlikely to change significantly thus could likely benefit from lifelong prophylaxis. At this point we will favor 6 months of treatment and then reevaluate.  Weakness of both lower extremities Patient is significantly improved with regards this. She's been getting up and walking with physical therapy and home health. Walking with a walker now. She'll continue physical therapy exercises and continue to monitor.  B12 deficiency Patient reports completing 2 cycles of B12 injections at home. 2 more to be done and then B12 to be rechecked.  . Given return precautions.  Kathleen AlarEric Karlea Mckibbin, MD [facility] [place]

## 2015-10-14 NOTE — Progress Notes (Signed)
Pre visit review using our clinic review tool, if applicable. No additional management support is needed unless otherwise documented below in the visit note. 

## 2015-10-14 NOTE — Assessment & Plan Note (Signed)
Patient with left lower extremity DVT currently on Eliquis. Tolerating medication. No chest pain or shortness of breath. Swelling is resolved. We'll continue Eliquis at this time. Event was likely related to immobility and level of mobility is unlikely to change significantly thus could likely benefit from lifelong prophylaxis. At this point we will favor 6 months of treatment and then reevaluate.

## 2015-10-18 DIAGNOSIS — Z7901 Long term (current) use of anticoagulants: Secondary | ICD-10-CM | POA: Diagnosis not present

## 2015-10-18 DIAGNOSIS — R32 Unspecified urinary incontinence: Secondary | ICD-10-CM | POA: Diagnosis not present

## 2015-10-18 DIAGNOSIS — R531 Weakness: Secondary | ICD-10-CM | POA: Diagnosis not present

## 2015-10-18 DIAGNOSIS — M7989 Other specified soft tissue disorders: Secondary | ICD-10-CM | POA: Diagnosis not present

## 2015-10-18 DIAGNOSIS — I10 Essential (primary) hypertension: Secondary | ICD-10-CM | POA: Diagnosis not present

## 2015-10-22 DIAGNOSIS — M7989 Other specified soft tissue disorders: Secondary | ICD-10-CM | POA: Diagnosis not present

## 2015-10-22 DIAGNOSIS — Z7901 Long term (current) use of anticoagulants: Secondary | ICD-10-CM | POA: Diagnosis not present

## 2015-10-22 DIAGNOSIS — I10 Essential (primary) hypertension: Secondary | ICD-10-CM | POA: Diagnosis not present

## 2015-10-22 DIAGNOSIS — R531 Weakness: Secondary | ICD-10-CM | POA: Diagnosis not present

## 2015-10-22 DIAGNOSIS — R32 Unspecified urinary incontinence: Secondary | ICD-10-CM | POA: Diagnosis not present

## 2015-10-25 DIAGNOSIS — Z7901 Long term (current) use of anticoagulants: Secondary | ICD-10-CM | POA: Diagnosis not present

## 2015-10-25 DIAGNOSIS — R531 Weakness: Secondary | ICD-10-CM | POA: Diagnosis not present

## 2015-10-25 DIAGNOSIS — I10 Essential (primary) hypertension: Secondary | ICD-10-CM | POA: Diagnosis not present

## 2015-10-25 DIAGNOSIS — M7989 Other specified soft tissue disorders: Secondary | ICD-10-CM | POA: Diagnosis not present

## 2015-10-25 DIAGNOSIS — R32 Unspecified urinary incontinence: Secondary | ICD-10-CM | POA: Diagnosis not present

## 2015-11-15 ENCOUNTER — Encounter: Payer: Self-pay | Admitting: Family Medicine

## 2015-11-15 ENCOUNTER — Ambulatory Visit (INDEPENDENT_AMBULATORY_CARE_PROVIDER_SITE_OTHER): Payer: Medicare Other | Admitting: Family Medicine

## 2015-11-15 VITALS — BP 130/88 | HR 64 | Temp 98.6°F

## 2015-11-15 DIAGNOSIS — R29898 Other symptoms and signs involving the musculoskeletal system: Secondary | ICD-10-CM

## 2015-11-15 DIAGNOSIS — E538 Deficiency of other specified B group vitamins: Secondary | ICD-10-CM | POA: Diagnosis not present

## 2015-11-15 DIAGNOSIS — L853 Xerosis cutis: Secondary | ICD-10-CM | POA: Diagnosis not present

## 2015-11-15 DIAGNOSIS — I82412 Acute embolism and thrombosis of left femoral vein: Secondary | ICD-10-CM

## 2015-11-15 LAB — VITAMIN B12: Vitamin B-12: 924 pg/mL — ABNORMAL HIGH (ref 211–911)

## 2015-11-15 NOTE — Assessment & Plan Note (Signed)
Doing well. Tolerating Eliquis. Minimal bleeding from her gums. No other bleeding. She'll continue Eliquis for at least 6 months. She'll follow-up in 3 months.

## 2015-11-15 NOTE — Patient Instructions (Signed)
Nice to see you. Please continue to do the exercises at home and get up as you're able to. Nexium please continue the Eliquis. Please see a dentist. You can try Eucerin cream for your legs. We'll recheck her B12 today. If you develop numbness, weakness, chest pain, shortness of breath, bleeding, or any new or changing symptoms please seek medical attention.

## 2015-11-15 NOTE — Assessment & Plan Note (Signed)
Dry skin bilateral legs. Suggested Eucerin.

## 2015-11-15 NOTE — Assessment & Plan Note (Signed)
Recheck B12 today.   

## 2015-11-15 NOTE — Assessment & Plan Note (Signed)
Significantly improved. Continues to get better with getting up and walking around with walker. She'll continue physical therapy exercises and monitor.

## 2015-11-15 NOTE — Progress Notes (Signed)
  Marikay Alar, MD Phone: [phone]  Kathleen Barton is a 75 y.o. female who presents today for follow-up.  DVT: No swelling in her legs. No chest pain or shortness of breath. Notes she is taking the Eliquis daily. Minimal bleeding from her gums when she brushes her teeth occasionally. No dentures. Does irregularly floss though only brushes her teeth once a day. Has poor dentition. No bleeding from elsewhere.  Lower extremity weakness: Patient notes this is improved. She gets up and walks with her walker at home. Continuing to do physical therapy exercises at home.  Dry skin: Notes long history of dry skin on her legs. She tried multiple topical treatments. No improvement with Aveeno or [place]. Notes she had itching with one of these lotions. No rash.  B12 deficiency: Has been doing B12 shots at home. Needs recheck today. Feels well.  PMH: nonsmoker.   ROS see history of present illness  Objective  Physical Exam Vitals:   11/15/15 1322  BP: 130/88  Pulse: 64  Temp: 98.6 F (37 C)    BP Readings from Last 3 Encounters:  11/15/15 130/88  10/14/15 118/72  08/27/15 (!) 166/65   Wt Readings from Last 3 Encounters:  08/27/15 (!) 381 lb (172.8 kg)  08/27/15 (!) 381 lb (172.8 kg)    Physical Exam  Constitutional: No distress.  HENT:  Extremely poor dentition, no swelling of the gums or erythema  Cardiovascular: Normal rate, regular rhythm and normal heart sounds.   Pulmonary/Chest: Effort normal and breath sounds normal.  Musculoskeletal:  No swelling in bilateral lower extremities, bilateral calves with no cords or tenderness  Neurological: She is alert.  Skin: Skin is warm and dry. She is not diaphoretic.  Dry skin noted bilateral lower extremities     Assessment/Plan: Please see individual problem list.  DVT (deep venous thrombosis) (HCC) Doing well. Tolerating Eliquis. Minimal bleeding from her gums. No other bleeding. She'll continue Eliquis for at least 6  months. She'll follow-up in 3 months.  B12 deficiency Recheck B12 today.  Weakness of both lower extremities Significantly improved. Continues to get better with getting up and walking around with walker. She'll continue physical therapy exercises and monitor.  Dry skin Dry skin bilateral legs. Suggested Eucerin.   Orders Placed This Encounter  Procedures  . B12    No orders of the defined types were placed in this encounter.   Marikay Alar, MD [facility]

## 2015-11-16 ENCOUNTER — Other Ambulatory Visit: Payer: Self-pay | Admitting: Family Medicine

## 2015-11-16 NOTE — Telephone Encounter (Signed)
Pt needs refills on PARoxetine (PAXIL) 30 MG tablet and atenolol-chlorthalidone (TENORETIC) 50-25 MG tablet sent to [facility].. Pt states that the pharmacy has no refills on these.Marland Kitchen Please advise

## 2015-11-16 NOTE — Telephone Encounter (Signed)
Neither of these have been filled by Dr. Birdie Sons, Please advise for refills, thanks

## 2015-11-17 MED ORDER — PAROXETINE HCL 30 MG PO TABS: 30.0000 mg | ORAL_TABLET | Freq: Every day | ORAL | 1 refills | Status: DC

## 2015-11-17 MED ORDER — ATENOLOL-CHLORTHALIDONE 50-25 MG PO TABS: 1.0000 | ORAL_TABLET | Freq: Every day | ORAL | 3 refills | Status: DC

## 2015-11-17 NOTE — Telephone Encounter (Signed)
Refill given

## 2015-12-20 ENCOUNTER — Other Ambulatory Visit: Payer: Self-pay | Admitting: Family Medicine

## 2016-02-18 ENCOUNTER — Encounter: Payer: Self-pay | Admitting: Family Medicine

## 2016-02-18 ENCOUNTER — Ambulatory Visit (INDEPENDENT_AMBULATORY_CARE_PROVIDER_SITE_OTHER): Payer: Medicare Other | Admitting: Family Medicine

## 2016-02-18 VITALS — BP 138/86 | HR 81 | Temp 97.8°F | Wt 342.0 lb

## 2016-02-18 DIAGNOSIS — I10 Essential (primary) hypertension: Secondary | ICD-10-CM | POA: Diagnosis not present

## 2016-02-18 DIAGNOSIS — E66813 Obesity, class 3: Secondary | ICD-10-CM | POA: Insufficient documentation

## 2016-02-18 DIAGNOSIS — I82412 Acute embolism and thrombosis of left femoral vein: Secondary | ICD-10-CM

## 2016-02-18 MED ORDER — APIXABAN 5 MG PO TABS: 5.0000 mg | ORAL_TABLET | Freq: Two times a day (BID) | ORAL | 2 refills | Status: DC

## 2016-02-18 NOTE — Progress Notes (Signed)
Pre visit review using our clinic review tool, if applicable. No additional management support is needed unless otherwise documented below in the visit note. 

## 2016-02-18 NOTE — Assessment & Plan Note (Signed)
Doing well with this. No evidence of recurrence. She'll continue Eliquis. Did discuss at some point we will have a discussion of whether or not to continue this. She had an unprovoked DVT potentially related to inactivity. Advised that if her activity levels are not going to change she would likely need lifelong Eliquis. She is given return precautions.

## 2016-02-18 NOTE — Assessment & Plan Note (Signed)
At goal. Continue current medications. 

## 2016-02-18 NOTE — Patient Instructions (Signed)
Nice to see you. Congratulations on losing 40 pounds. Please keep up with the diet. Please start exercising by walking more. Please keep your dental appointment.  Please take your Eliquis twice daily. We will check some lab work and call with results. If you develop chest pain, shortness of breath, swelling in her legs, pain in her legs, or any new or change in terms please seek medical attention immediately.

## 2016-02-18 NOTE — Assessment & Plan Note (Signed)
Weight is down 39 pounds. Encouraged continued dietary changes. Encouraged exercise.

## 2016-02-18 NOTE — Progress Notes (Signed)
  Kathleen AlarEric Mekenzie Modeste, MD Phone: [phone]  Kathleen Barton is a 75 y.o. female who presents today for follow-up.  DVT: Patient is doing well with regards to this. Taking Eliquis. No issues with bleeding. No swelling or pain in her leg. No chest pain or shortness of breath.  Obesity: Patient is down 39 pounds since her last office visit. She's been changing her diet and eating healthier. Her daughter-in-law's trying to control her diet some. Eating a protein and mostly vegetables at dinner. Not snacking as much. Has not started exercise very much.  She sees a Education officer, communitydentist in January for her poor dentition.  HYPERTENSION  Disease Monitoring  Home BP Monitoring daughter-in-law reports her blood pressures are typically in the normal range Chest pain- no    Dyspnea- no Medications  Compliance-  taking atenolol, chlorthalidone.  Edema- no   PMH: nonsmoker.   ROS see history of present illness  Objective  Physical Exam Vitals:   02/18/16 1341  BP: 138/86  Pulse: 81  Temp: 97.8 F (36.6 C)    BP Readings from Last 3 Encounters:  02/18/16 138/86  11/15/15 130/88  10/14/15 118/72   Wt Readings from Last 3 Encounters:  02/18/16 (!) 342 lb (155.1 kg)  08/27/15 (!) 381 lb (172.8 kg)  08/27/15 (!) 381 lb (172.8 kg)    Physical Exam  Constitutional: No distress.  Cardiovascular: Normal rate, regular rhythm and normal heart sounds.   Pulmonary/Chest: Effort normal and breath sounds normal.  Musculoskeletal:  Bilateral lower extremities with no swelling, calf tenderness, or cords  Neurological: She is alert.  Skin: Skin is warm and dry. She is not diaphoretic.     Assessment/Plan: Please see individual problem list.  DVT (deep venous thrombosis) (HCC) Doing well with this. No evidence of recurrence. She'll continue Eliquis. Did discuss at some point we will have a discussion of whether or not to continue this. She had an unprovoked DVT potentially related to inactivity. Advised  that if her activity levels are not going to change she would likely need lifelong Eliquis. She is given return precautions.  Essential hypertension At goal. Continue current medications.  Morbid obesity (HCC) Weight is down 39 pounds. Encouraged continued dietary changes. Encouraged exercise.  She will keep her appointment with her dentist.  Orders Placed This Encounter  Procedures  . Basic metabolic panel    Meds ordered this encounter  Medications  . apixaban (ELIQUIS) 5 MG TABS tablet    Sig: Take 1 tablet (5 mg total) by mouth 2 (two) times daily.    Dispense:  60 tablet    Refill:  2    This prescription was filled on 12/20/2015. Any refills authorized will be placed on file.    Kathleen AlarEric Kathleen Arrick, MD [facility]

## 2016-02-18 NOTE — Addendum Note (Signed)
Addended by: Felix AhmadiFRANSEN, Daje Stark A on: 02/18/2016 02:21 PM   Modules accepted: Orders

## 2016-04-12 ENCOUNTER — Telehealth: Payer: Self-pay | Admitting: *Deleted

## 2016-04-12 NOTE — Telephone Encounter (Signed)
Have you seen a Prior authorization on this ?

## 2016-04-12 NOTE — Telephone Encounter (Signed)
[facility]

## 2016-04-13 NOTE — Telephone Encounter (Signed)
Completed PA this am. thanks

## 2016-04-13 NOTE — Telephone Encounter (Signed)
Have you seen prior authorization on this?

## 2016-04-13 NOTE — Telephone Encounter (Signed)
Pt called and stated that she needs a PA for apixaban (ELIQUIS) 5 MG TABS tablet. She only has a weeks worth of medication left. Can we please call once we have completed this. Thank you!

## 2016-04-13 NOTE — Telephone Encounter (Signed)
See below , thanks

## 2016-04-13 NOTE — Telephone Encounter (Signed)
I have not seen a prior auth for this. Please contact her pharmacy to check on this. Thanks.

## 2016-04-13 NOTE — Telephone Encounter (Signed)
Patient notified prior authorization in process

## 2016-04-13 NOTE — Telephone Encounter (Signed)
This is sonnenberg's patient

## 2016-04-14 NOTE — Telephone Encounter (Signed)
PA approved till 10/12/2016

## 2016-05-01 ENCOUNTER — Telehealth: Payer: Self-pay | Admitting: Family Medicine

## 2016-05-01 NOTE — Telephone Encounter (Signed)
Filed for prior authorization for Eliquis , patient insurance may deny due to patient has not tried coumadin or Xarelto which patient insurance will cover. FYI

## 2016-05-01 NOTE — Telephone Encounter (Signed)
Noted. Could consider Xarelto if prior authorization is not improved.

## 2016-05-09 ENCOUNTER — Other Ambulatory Visit: Payer: Self-pay | Admitting: Family Medicine

## 2016-05-26 ENCOUNTER — Ambulatory Visit: Payer: Medicare Other | Admitting: Family Medicine

## 2016-06-02 ENCOUNTER — Ambulatory Visit: Payer: Medicare Other | Admitting: Family Medicine

## 2016-07-06 ENCOUNTER — Other Ambulatory Visit: Payer: Self-pay | Admitting: Family Medicine

## 2016-07-14 ENCOUNTER — Ambulatory Visit (INDEPENDENT_AMBULATORY_CARE_PROVIDER_SITE_OTHER): Payer: Medicare Other | Admitting: Family Medicine

## 2016-07-14 ENCOUNTER — Encounter: Payer: Self-pay | Admitting: Family Medicine

## 2016-07-14 ENCOUNTER — Ambulatory Visit: Payer: Medicare Other | Admitting: Family Medicine

## 2016-07-14 DIAGNOSIS — E538 Deficiency of other specified B group vitamins: Secondary | ICD-10-CM | POA: Diagnosis not present

## 2016-07-14 DIAGNOSIS — Z1231 Encounter for screening mammogram for malignant neoplasm of breast: Secondary | ICD-10-CM

## 2016-07-14 DIAGNOSIS — I10 Essential (primary) hypertension: Secondary | ICD-10-CM

## 2016-07-14 DIAGNOSIS — Z1239 Encounter for other screening for malignant neoplasm of breast: Secondary | ICD-10-CM

## 2016-07-14 DIAGNOSIS — Z86718 Personal history of other venous thrombosis and embolism: Secondary | ICD-10-CM

## 2016-07-14 LAB — CBC
HEMATOCRIT: 40.5 % (ref 36.0–46.0)
HEMOGLOBIN: 13.3 g/dL (ref 12.0–15.0)
MCHC: 32.7 g/dL (ref 30.0–36.0)
MCV: 92.7 fl (ref 78.0–100.0)
Platelets: 241 10*3/uL (ref 150.0–400.0)
RBC: 4.38 Mil/uL (ref 3.87–5.11)
RDW: 13.5 % (ref 11.5–15.5)
WBC: 13 10*3/uL — AB (ref 4.0–10.5)

## 2016-07-14 LAB — COMPREHENSIVE METABOLIC PANEL
ALT: 11 U/L (ref 0–35)
AST: 8 U/L (ref 0–37)
Albumin: 3.8 g/dL (ref 3.5–5.2)
Alkaline Phosphatase: 58 U/L (ref 39–117)
BUN: 29 mg/dL — ABNORMAL HIGH (ref 6–23)
CALCIUM: 9.8 mg/dL (ref 8.4–10.5)
CHLORIDE: 99 meq/L (ref 96–112)
CO2: 33 meq/L — AB (ref 19–32)
CREATININE: 1 mg/dL (ref 0.40–1.20)
GFR: 57.15 mL/min — ABNORMAL LOW (ref >60.00)
Glucose, Bld: 148 mg/dL — ABNORMAL HIGH (ref 70–99)
Potassium: 3.9 mEq/L (ref 3.5–5.1)
Sodium: 140 mEq/L (ref 135–145)
Total Bilirubin: 0.4 mg/dL (ref 0.2–1.2)
Total Protein: 7.7 g/dL (ref 6.0–8.3)

## 2016-07-14 LAB — VITAMIN B12: Vitamin B-12: 251 pg/mL (ref 211–911)

## 2016-07-14 LAB — HEMOGLOBIN A1C: Hgb A1c MFr Bld: 6.1 % (ref 4.6–6.5)

## 2016-07-14 NOTE — Assessment & Plan Note (Signed)
Doing well. No evidence of recurrence. I had a discussion with her and her daughter-in-law regarding her Eliquis. She's not had any bleeding. Discussed that her DVT was likely a result of her inactivity and her obesity. These things are unlikely to change. I believe the benefit of the Eliquis outweighs the risk of it at this point. She agreed and we will continue this medication. Advised of bleeding precautions. We will check a CBC as well.

## 2016-07-14 NOTE — Progress Notes (Signed)
  Tommi Rumps, MD Phone: [phone]  Kathleen Barton is a 76 y.o. female who presents today for follow-up.  Hypertension: Not checking blood pressure. She is on atenolol and chlorthalidone. The chest pain, shortness breath, or edema.  DVT: She has a history of this about 10 months ago. She's been taking Eliquis. She notes no chest pain or swelling. No bleeding issues.  Morbid obesity: Has lost some weight by decreasing the amount of junk food she eats. She reports she is down about 11 pounds. She is getting up and moving more though not to any significant degree. Her daughter-in-law is helping significantly with this.  PMH: nonsmoker.   ROS see history of present illness  Objective  Physical Exam Vitals:   07/14/16 1356  BP: 128/82  Pulse: 86  Temp: 98.1 F (36.7 C)    BP Readings from Last 3 Encounters:  07/14/16 128/82  02/18/16 138/86  11/15/15 130/88   Wt Readings from Last 3 Encounters:  07/14/16 (!) 331 lb (150.1 kg)  02/18/16 (!) 342 lb (155.1 kg)  08/27/15 (!) 381 lb (172.8 kg)    Physical Exam  Constitutional: No distress.  Cardiovascular: Normal rate, regular rhythm and normal heart sounds.   Pulmonary/Chest: Effort normal and breath sounds normal.  Musculoskeletal: She exhibits no edema.  No lower external swelling or calf tenderness or cords  Neurological: She is alert.  Skin: Skin is warm and dry. She is not diaphoretic.     Assessment/Plan: Please see individual problem list.  Essential hypertension At goal. Continue current medications. Check CMP.  History of DVT of lower extremity Doing well. No evidence of recurrence. I had a discussion with her and her daughter-in-law regarding her Eliquis. She's not had any bleeding. Discussed that her DVT was likely a result of her inactivity and her obesity. These things are unlikely to change. I believe the benefit of the Eliquis outweighs the risk of it at this point. She agreed and we will continue  this medication. Advised of bleeding precautions. We will check a CBC as well.  Morbid obesity ([place]) Weight is trending down. Advise continued diet diet changes and trying to be more active.  B12 deficiency Recheck B12.   Orders Placed This Encounter  Procedures  . MM SCREENING BREAST TOMO BILATERAL    Standing Status:   Future    Standing Expiration Date:   09/13/2017    Order Specific Question:   Reason for Exam (SYMPTOM  OR DIAGNOSIS REQUIRED)    Answer:   breast cancer screening    Order Specific Question:   Preferred imaging location?    Answer:   [facility]  . HgB A1c  . Comp Met (CMET)  . B12  . CBC   Tommi Rumps, MD [place]

## 2016-07-14 NOTE — Progress Notes (Signed)
Pre visit review using our clinic review tool, if applicable. No additional management support is needed unless otherwise documented below in the visit note. 

## 2016-07-14 NOTE — Patient Instructions (Signed)
Nice to see you. Please continue to stay active. We will check lab work today and contact her with results.

## 2016-07-14 NOTE — Assessment & Plan Note (Signed)
At goal. Continue current medications. Check CMP. 

## 2016-07-14 NOTE — Assessment & Plan Note (Addendum)
Weight is trending down. Advise continued diet diet changes and trying to be more active.

## 2016-07-14 NOTE — Assessment & Plan Note (Signed)
Recheck B12 

## 2016-07-25 ENCOUNTER — Other Ambulatory Visit: Payer: Self-pay | Admitting: Family Medicine

## 2016-07-25 DIAGNOSIS — D72829 Elevated white blood cell count, unspecified: Secondary | ICD-10-CM

## 2016-07-27 ENCOUNTER — Telehealth: Payer: Self-pay | Admitting: Family Medicine

## 2016-07-27 NOTE — Telephone Encounter (Signed)
Pt will call back to schedule AWV °

## 2016-08-11 ENCOUNTER — Other Ambulatory Visit: Payer: Medicare Other

## 2016-08-18 ENCOUNTER — Other Ambulatory Visit (INDEPENDENT_AMBULATORY_CARE_PROVIDER_SITE_OTHER): Payer: Medicare Other

## 2016-08-18 DIAGNOSIS — D72829 Elevated white blood cell count, unspecified: Secondary | ICD-10-CM | POA: Diagnosis not present

## 2016-08-18 LAB — CBC WITH DIFFERENTIAL/PLATELET
BASOS ABS: 0.1 10*3/uL (ref 0.0–0.1)
Basophils Relative: 0.9 % (ref 0.0–3.0)
EOS PCT: 1.9 % (ref 0.0–5.0)
Eosinophils Absolute: 0.2 10*3/uL (ref 0.0–0.7)
HEMATOCRIT: 41.9 % (ref 36.0–46.0)
Hemoglobin: 13.6 g/dL (ref 12.0–15.0)
LYMPHS PCT: 28.2 % (ref 12.0–46.0)
Lymphs Abs: 3.1 10*3/uL (ref 0.7–4.0)
MCHC: 32.5 g/dL (ref 30.0–36.0)
MCV: 94.4 fl (ref 78.0–100.0)
MONOS PCT: 8.8 % (ref 3.0–12.0)
Monocytes Absolute: 1 10*3/uL (ref 0.1–1.0)
NEUTROS ABS: 6.6 10*3/uL (ref 1.4–7.7)
Neutrophils Relative %: 60.2 % (ref 43.0–77.0)
PLATELETS: 271 10*3/uL (ref 150.0–400.0)
RBC: 4.44 Mil/uL (ref 3.87–5.11)
RDW: 14.5 % (ref 11.5–15.5)
WBC: 11 10*3/uL — ABNORMAL HIGH (ref 4.0–10.5)

## 2016-08-21 ENCOUNTER — Other Ambulatory Visit: Payer: Self-pay | Admitting: Family Medicine

## 2016-08-21 DIAGNOSIS — D72829 Elevated white blood cell count, unspecified: Secondary | ICD-10-CM

## 2016-08-25 NOTE — Telephone Encounter (Signed)
Scheduled 09/08/16 °

## 2016-09-08 ENCOUNTER — Other Ambulatory Visit: Payer: Medicare Other

## 2016-09-08 ENCOUNTER — Ambulatory Visit (INDEPENDENT_AMBULATORY_CARE_PROVIDER_SITE_OTHER): Payer: Medicare Other

## 2016-09-08 VITALS — BP 126/78 | HR 83 | Temp 98.3°F | Resp 16 | Ht 68.0 in | Wt 330.8 lb

## 2016-09-08 DIAGNOSIS — Z23 Encounter for immunization: Secondary | ICD-10-CM

## 2016-09-08 DIAGNOSIS — E2839 Other primary ovarian failure: Secondary | ICD-10-CM | POA: Diagnosis not present

## 2016-09-08 DIAGNOSIS — Z0000 Encounter for general adult medical examination without abnormal findings: Secondary | ICD-10-CM

## 2016-09-08 DIAGNOSIS — D72829 Elevated white blood cell count, unspecified: Secondary | ICD-10-CM | POA: Diagnosis not present

## 2016-09-08 LAB — CBC WITH DIFFERENTIAL/PLATELET
Basophils Absolute: 104 cells/uL (ref 0–200)
Basophils Relative: 1 %
EOS PCT: 2 %
Eosinophils Absolute: 208 cells/uL (ref 15–500)
HCT: 40.2 % (ref 35.0–45.0)
HEMOGLOBIN: 12.9 g/dL (ref 11.7–15.5)
LYMPHS ABS: 1976 {cells}/uL (ref 850–3900)
LYMPHS PCT: 19 %
MCH: 30.1 pg (ref 27.0–33.0)
MCHC: 32.1 g/dL (ref 32.0–36.0)
MCV: 93.9 fL (ref 80.0–100.0)
MONOS PCT: 8 %
MPV: 10.7 fL (ref 7.5–12.5)
Monocytes Absolute: 832 cells/uL (ref 200–950)
NEUTROS PCT: 70 %
Neutro Abs: 7280 cells/uL (ref 1500–7800)
PLATELETS: 233 10*3/uL (ref 140–400)
RBC: 4.28 MIL/uL (ref 3.80–5.10)
RDW: 13.7 % (ref 11.0–15.0)
WBC: 10.4 10*3/uL (ref 3.8–10.8)

## 2016-09-08 NOTE — Progress Notes (Signed)
Subjective:   Kathleen Barton is a 76 y.o. female who presents for an Initial Medicare Annual Wellness Visit.  Review of Systems    No ROS.  Medicare Wellness Visit.  Cardiac Risk Factors include: advanced age (>39men, >89 women);hypertension;obesity (BMI >30kg/m2)     Objective:    Today's Vitals   09/08/16 1400  BP: 126/78  Pulse: 83  Resp: 16  Temp: 98.3 F (36.8 C)  TempSrc: Oral  SpO2: 96%  Weight: (!) 330 lb 12.8 oz (150 kg)  Height: 5\' 8"  (1.727 m)   Body mass index is 50.3 kg/m.   Current Medications (verified) Outpatient Encounter Prescriptions as of 09/08/2016  Medication Sig  . atenolol-chlorthalidone (TENORETIC) 50-25 MG tablet Take 1 tablet by mouth daily. for blood pressure  . cholecalciferol (VITAMIN D) 1000 units tablet Take 1,000 Units by mouth daily.  Marland Kitchen ELIQUIS 5 MG TABS tablet TAKE ONE TABLET BY MOUTH TWICE DAILY  . meclizine (ANTIVERT) 25 MG tablet Take 25 mg by mouth 3 (three) times daily as needed for dizziness.  Marland Kitchen PARoxetine (PAXIL) 30 MG tablet Take 1 tablet (30 mg total) by mouth daily.   No facility-administered encounter medications on file as of 09/08/2016.     Allergies (verified) Patient has no known allergies.   History: Past Medical History:  Diagnosis Date  . Hypertension   . Urinary incontinence    Past Surgical History:  Procedure Laterality Date  . ABDOMINAL HYSTERECTOMY  1974   Family History  Problem Relation Age of Onset  . Heart disease Unknown   . Hypertension Unknown    Social History   Occupational History  . Not on file.   Social History Main Topics  . Smoking status: Never Smoker  . Smokeless tobacco: Never Used  . Alcohol use No  . Drug use: No  . Sexual activity: No    Tobacco Counseling Counseling given: Not Answered   Activities of Daily Living In your present state of health, do you have any difficulty performing the following activities: 09/08/2016  Hearing? N  Vision? N  Difficulty  concentrating or making decisions? N  Walking or climbing stairs? Y  Dressing or bathing? Y  Doing errands, shopping? Y  Preparing Food and eating ? Y  Using the Toilet? N  In the past six months, have you accidently leaked urine? Y  Do you have problems with loss of bowel control? N  Managing your Medications? N  Managing your Finances? N  Housekeeping or managing your Housekeeping? Y  Some recent data might be hidden    Immunizations and Health Maintenance Immunization History  Administered Date(s) Administered  . Pneumococcal Conjugate-13 09/08/2016   Health Maintenance Due  Topic Date Due  . TETANUS/TDAP  08/01/1958  . DEXA SCAN  07/31/2004    Patient Care Team: Glori Luis, MD as PCP - General (Family Medicine)  Indicate any recent Medical Services you may have received from other than [facility] providers in the past year (date may be approximate).     Assessment:   This is a routine wellness examination for Kathleen Barton. The goal of the wellness visit is to assist the patient how to close the gaps in care and create a preventative care plan for the patient.   Taking calcium VIT D as appropriate/Osteoporosis risk reviewed.  Medications reviewed; taking without issues or barriers.  Safety issues reviewed; smoke detectors in the home. No firearms in the home.  Wears seatbelts when driving or riding with others.  Patient does wear sunscreen or protective clothing when in direct sunlight. No violence in the home.  Depression- PHQ 2 &9 complete.  No signs/symptoms or verbal communication regarding little pleasure in doing things, feeling down, depressed or hopeless. No changes in sleeping, energy, eating, concentrating.  No thoughts of self harm or harm towards others.  Time spent on this topic is 8 minutes.   Patient is alert, normal appearance, oriented to person/place/and time. Correctly identified the president of the [place], recall of 3/3 words, and performing simple  calculations.  Patient displays appropriate judgement and can read correct time from watch face.  No new identified risk were noted.  No failures at ADL's or IADL's. Wheelchair in use as needed when ambulating.  Daughter assists with bathing/dressing/meal prep.  BMI- discussed the importance of a healthy diet, water intake and exercise. Educational material provided.   24 hour dietrecall: Breakfast: Ham sandwich, fruit Lunch: Yogurt, popcorn Dinner: green vegetable Daily fluid intake: 6 cups of diet Dr. Reino KentPepper, 0 cups of water Encouraged water intake increase  HTN- followed by PCP.  Sleep patterns- Sleeps 8 hours at night.  Wakes feeling rested.  Prevnar 13 vaccine administered L deltoid, tolerated well. Educational material provided.  Dexa Scan ordered; follow as directed.  Educational material provided.  TDAP vaccine deferred per patient preference.  Follow up with insurance.  Educational material provided.  Patient Concerns: None at this time. Follow up with PCP as needed.  Hearing/Vision screen Hearing Screening Comments: Patient is able to hear conversational tones without difficulty.  No issues reported.  Vision Screening Comments: [facility] Wears corrective lenses Visual acuity not assessed per patient preference since they have regular follow up with the ophthalmologist  Dietary issues and exercise activities discussed: Current Exercise Habits: Home exercise routine, Type of exercise: walking, Time (Minutes): 10, Frequency (Times/Week): 2, Weekly Exercise (Minutes/Week): 20, Intensity: Mild  Goals    . Increase physical activity          Walk for exercise as tolerated    . Increase water intake          Stay hydrated and drink plenty of water.        Depression Screen PHQ 2/9 Scores 09/08/2016  PHQ - 2 Score 0  PHQ- 9 Score 0    Fall Risk Fall Risk  09/08/2016  Falls in the past year? No    Cognitive Function: MMSE - Mini Mental  State Exam 09/08/2016  Orientation to time 5  Orientation to Place 5  Registration 3  Attention/ Calculation 5  Recall 3  Language- name 2 objects 2  Language- repeat 1  Language- follow 3 step command 3  Language- read & follow direction 1  Write a sentence 1  Copy design 1  Total score 30        Screening Tests Health Maintenance  Topic Date Due  . TETANUS/TDAP  08/01/1958  . DEXA SCAN  07/31/2004  . INFLUENZA VACCINE  07/14/2017 (Originally 11/22/2016)  . PNA vac Low Risk Adult (2 of 2 - PPSV23) 09/08/2017      Plan:   End of life planning; Advanced aging; Advanced directives discussed.  No HCPOA/Living Will.  Additional information provided to help them start the conversation with family.  Copy of HCPOA/Living Will requested upon completion. Time spent on this topic is 25 minutes.  I have personally reviewed and noted the following in the patient's chart:   . Medical and social history . Use of  alcohol, tobacco or illicit drugs  . Current medications and supplements . Functional ability and status . Nutritional status . Physical activity . Advanced directives . List of other physicians . Hospitalizations, surgeries, and ER visits in previous 12 months . Vitals . Screenings to include cognitive, depression, and falls . Referrals and appointments  In addition, I have reviewed and discussed with patient certain preventive protocols, quality metrics, and best practice recommendations. A written personalized care plan for preventive services as well as general preventive health recommendations were provided to patient.     Ashok Pall, LPN   1/61/0960

## 2016-09-08 NOTE — Addendum Note (Signed)
Addended by: Penne LashWIGGINS, Mikias Lanz N on: 09/08/2016 03:05 PM   Modules accepted: Orders

## 2016-09-08 NOTE — Patient Instructions (Addendum)
  Ms. Kathleen Barton , Thank you for taking time to come for your Medicare Wellness Visit. I appreciate your ongoing commitment to your health goals. Please review the following plan we discussed and let me know if I can assist you in the future.   Follow up with Dr. Birdie SonsSonnenberg as needed.    Bring a copy of your Health Care Power of Attorney and/or Living Will to be scanned into chart once completed once completed.  Have a great day!  These are the goals we discussed: Goals    . Increase physical activity          Walk for exercise as tolerated    . Increase water intake          Stay hydrated and drink plenty of water.         This is a list of the screening recommended for you and due dates:  Health Maintenance  Topic Date Due  . Tetanus Vaccine  08/01/1958  . DEXA scan (bone density measurement)  07/31/2004  . Flu Shot  07/14/2017*  . Pneumonia vaccines (2 of 2 - PPSV23) 09/08/2017  *Topic was postponed. The date shown is not the original due date.

## 2016-09-11 LAB — PATHOLOGIST SMEAR REVIEW

## 2016-09-11 NOTE — Progress Notes (Signed)
I have reviewed the above note and agree.  Lougenia Morrissey, M.D.  

## 2016-09-20 ENCOUNTER — Telehealth: Payer: Self-pay | Admitting: Radiology

## 2016-09-20 DIAGNOSIS — R718 Other abnormality of red blood cells: Secondary | ICD-10-CM

## 2016-09-20 NOTE — Telephone Encounter (Signed)
Pt coming in for labs Friday, please place future orders. Thank you.  

## 2016-09-21 NOTE — Addendum Note (Signed)
Addended by: Glori LuisSONNENBERG, Immaculate Crutcher G on: 09/21/2016 09:39 AM   Modules accepted: Orders

## 2016-09-21 NOTE — Telephone Encounter (Signed)
Orders placed.

## 2016-09-22 ENCOUNTER — Other Ambulatory Visit: Payer: Medicare Other

## 2016-09-29 ENCOUNTER — Other Ambulatory Visit: Payer: Medicare Other

## 2016-10-06 ENCOUNTER — Other Ambulatory Visit (INDEPENDENT_AMBULATORY_CARE_PROVIDER_SITE_OTHER): Payer: Medicare Other

## 2016-10-06 DIAGNOSIS — R718 Other abnormality of red blood cells: Secondary | ICD-10-CM

## 2016-10-06 LAB — CBC
HEMATOCRIT: 40.6 % (ref 36.0–46.0)
HEMOGLOBIN: 13.2 g/dL (ref 12.0–15.0)
MCHC: 32.5 g/dL (ref 30.0–36.0)
MCV: 93.7 fl (ref 78.0–100.0)
Platelets: 259 10*3/uL (ref 150.0–400.0)
RBC: 4.34 Mil/uL (ref 3.87–5.11)
RDW: 14.2 % (ref 11.5–15.5)
WBC: 9.6 10*3/uL (ref 4.0–10.5)

## 2016-10-06 LAB — RETICULOCYTES
ABS Retic: 91980 cells/uL — ABNORMAL HIGH (ref 20000–80000)
RBC.: 4.38 MIL/uL (ref 3.80–5.10)
Retic Ct Pct: 2.1 %

## 2016-10-07 LAB — IRON,TIBC AND FERRITIN PANEL
%SAT: 18 % (ref 11–50)
FERRITIN: 167 ng/mL (ref 20–288)
IRON: 62 ug/dL (ref 45–160)
TIBC: 344 ug/dL (ref 250–450)

## 2016-10-09 LAB — HAPTOGLOBIN: Haptoglobin: 258 mg/dL — ABNORMAL HIGH (ref 43–212)

## 2016-11-30 ENCOUNTER — Other Ambulatory Visit: Payer: Self-pay | Admitting: Family Medicine

## 2016-12-04 ENCOUNTER — Telehealth: Payer: Self-pay | Admitting: Family Medicine

## 2016-12-04 MED ORDER — PAROXETINE HCL 30 MG PO TABS: 30.0000 mg | ORAL_TABLET | Freq: Every day | ORAL | 1 refills | Status: DC

## 2016-12-04 MED ORDER — APIXABAN 5 MG PO TABS: 5.0000 mg | ORAL_TABLET | Freq: Two times a day (BID) | ORAL | 3 refills | Status: DC

## 2016-12-04 NOTE — Telephone Encounter (Signed)
Pt needs a refill for ELIQUIS 5 MG TABS tablet and PARoxetine (PAXIL) 30 MG tablet.  Pharmacy is [facility] [place] [place]  Thank you!

## 2016-12-04 NOTE — Telephone Encounter (Signed)
Sent to pharmacy 

## 2016-12-06 ENCOUNTER — Other Ambulatory Visit: Payer: Self-pay | Admitting: Family Medicine

## 2017-01-19 ENCOUNTER — Ambulatory Visit: Payer: Medicare Other | Admitting: Family Medicine

## 2017-02-16 ENCOUNTER — Ambulatory Visit: Payer: Medicare Other | Admitting: Family Medicine

## 2017-02-16 ENCOUNTER — Telehealth: Payer: Self-pay | Admitting: Family Medicine

## 2017-02-16 NOTE — Telephone Encounter (Signed)
FY - Pt called and cancelled her appt for this morning. Pt does not feel comfortable coming out in this weather.

## 2017-02-16 NOTE — Telephone Encounter (Signed)
fyi

## 2017-02-16 NOTE — Telephone Encounter (Signed)
Noted. Patient taken off schedule.

## 2017-03-30 ENCOUNTER — Ambulatory Visit: Payer: Medicare Other | Admitting: Family Medicine

## 2017-03-30 ENCOUNTER — Telehealth: Payer: Self-pay | Admitting: Family Medicine

## 2017-03-30 NOTE — Telephone Encounter (Signed)
Copied from CRM [phone]. Topic: Quick Communication - See Telephone Encounter >> Mar 30, 2017 11:36 AM Oneal GroutSebastian, Jennifer S wrote: CRM for notification. See Telephone encounter for: Requesting refill on atenolol-chlorthalidone (TENORETIC) 50-25 MG tablet [facility]  03/30/17.

## 2017-04-02 ENCOUNTER — Other Ambulatory Visit: Payer: Self-pay

## 2017-04-02 MED ORDER — ATENOLOL-CHLORTHALIDONE 50-25 MG PO TABS
1.0000 | ORAL_TABLET | Freq: Every day | ORAL | 3 refills | Status: DC
Start: 2017-04-02 — End: 2017-08-29

## 2017-06-08 ENCOUNTER — Ambulatory Visit (INDEPENDENT_AMBULATORY_CARE_PROVIDER_SITE_OTHER): Payer: Medicare Other | Admitting: Family Medicine

## 2017-06-08 ENCOUNTER — Other Ambulatory Visit: Payer: Self-pay

## 2017-06-08 ENCOUNTER — Encounter: Payer: Self-pay | Admitting: Family Medicine

## 2017-06-08 VITALS — BP 140/74 | HR 83 | Temp 98.5°F | Wt 347.0 lb

## 2017-06-08 DIAGNOSIS — Z780 Asymptomatic menopausal state: Secondary | ICD-10-CM

## 2017-06-08 DIAGNOSIS — N3941 Urge incontinence: Secondary | ICD-10-CM | POA: Insufficient documentation

## 2017-06-08 DIAGNOSIS — I10 Essential (primary) hypertension: Secondary | ICD-10-CM | POA: Diagnosis not present

## 2017-06-08 DIAGNOSIS — Z86718 Personal history of other venous thrombosis and embolism: Secondary | ICD-10-CM

## 2017-06-08 DIAGNOSIS — R928 Other abnormal and inconclusive findings on diagnostic imaging of breast: Secondary | ICD-10-CM

## 2017-06-08 DIAGNOSIS — E538 Deficiency of other specified B group vitamins: Secondary | ICD-10-CM | POA: Diagnosis not present

## 2017-06-08 DIAGNOSIS — Z1239 Encounter for other screening for malignant neoplasm of breast: Secondary | ICD-10-CM

## 2017-06-08 LAB — CBC
HEMATOCRIT: 42 % (ref 36.0–46.0)
Hemoglobin: 13.7 g/dL (ref 12.0–15.0)
MCHC: 32.5 g/dL (ref 30.0–36.0)
MCV: 95.4 fl (ref 78.0–100.0)
Platelets: 247 10*3/uL (ref 150.0–400.0)
RBC: 4.4 Mil/uL (ref 3.87–5.11)
RDW: 14.3 % (ref 11.5–15.5)
WBC: 14.7 10*3/uL — AB (ref 4.0–10.5)

## 2017-06-08 LAB — COMPREHENSIVE METABOLIC PANEL
ALBUMIN: 4 g/dL (ref 3.5–5.2)
ALT: 11 U/L (ref 0–35)
AST: 10 U/L (ref 0–37)
Alkaline Phosphatase: 53 U/L (ref 39–117)
BUN: 27 mg/dL — ABNORMAL HIGH (ref 6–23)
CHLORIDE: 99 meq/L (ref 96–112)
CO2: 29 mEq/L (ref 19–32)
CREATININE: 1.05 mg/dL (ref 0.40–1.20)
Calcium: 9.6 mg/dL (ref 8.4–10.5)
GFR: 53.89 mL/min — ABNORMAL LOW (ref >60.00)
Glucose, Bld: 186 mg/dL — ABNORMAL HIGH (ref 70–99)
Potassium: 3.7 mEq/L (ref 3.5–5.1)
SODIUM: 142 meq/L (ref 135–145)
TOTAL PROTEIN: 8.1 g/dL (ref 6.0–8.3)
Total Bilirubin: 0.5 mg/dL (ref 0.2–1.2)

## 2017-06-08 LAB — POCT URINALYSIS DIPSTICK
Bilirubin, UA: NEGATIVE
GLUCOSE UA: NEGATIVE
Ketones, UA: NEGATIVE
LEUKOCYTES UA: NEGATIVE
Nitrite, UA: POSITIVE
Protein, UA: 100
RBC UA: NEGATIVE
Spec Grav, UA: 1.025 (ref 1.010–1.025)
Urobilinogen, UA: 0.2 E.U./dL
pH, UA: 5 (ref 5.0–8.0)

## 2017-06-08 LAB — URINALYSIS, MICROSCOPIC ONLY

## 2017-06-08 LAB — VITAMIN B12: Vitamin B-12: 1086 pg/mL — ABNORMAL HIGH (ref 211–911)

## 2017-06-08 LAB — HEMOGLOBIN A1C: Hgb A1c MFr Bld: 6.1 % (ref 4.6–6.5)

## 2017-06-08 NOTE — Assessment & Plan Note (Signed)
Suspect somewhat related to immobility though given urinary smell and urgency will check a UA.  Advised to decrease her diet soda intake and increase water intake.

## 2017-06-08 NOTE — Assessment & Plan Note (Signed)
Check B12 today. 

## 2017-06-08 NOTE — Assessment & Plan Note (Signed)
Encouraged increasing exercise with a goal of walking 100 feet per day.  She can increase from there.  Discussed decreasing diet soda.

## 2017-06-08 NOTE — Assessment & Plan Note (Signed)
History of this.  Repeat ordered.

## 2017-06-08 NOTE — Assessment & Plan Note (Signed)
Doing well.  No evidence of recurrence.  She will continue on Eliquis.  Lab work today.

## 2017-06-08 NOTE — Patient Instructions (Addendum)
Nice to see you. We will check lab work today and contact you with the results. Please try to increase your activity daily with a goal of walking 100 feet. If you develop bleeding with the Eliquis that you cannot get to stop quickly please be evaluated. We will get a mammogram and colon cancer screening

## 2017-06-08 NOTE — Progress Notes (Signed)
Tommi Rumps, MD Phone: [phone]  Kathleen Barton is a 77 y.o. female who presents today for f/u.  History given by patient as well as daughter-in-law.  HYPERTENSION  Disease Monitoring  Home BP Monitoring not checking Chest pain- no    Dyspnea- no Medications  Compliance-  Taking atenolol, chlorthalidone.   Edema- no  DVT history:  Patient is pretty immobile.  Walks very little.  This places her at risk for recurrent DVT.  Prior DVT was unprovoked.  She has been maintaining on Eliquis adequately.  No bleeding.  No leg swelling.  They report her urine smells horrible.  They think it is related to the number of diet sodas she has been drinking.  She notes some intermittent urinary urgency.  Does note chronic urinary incontinence mostly related to her not being able to get up and walk very quickly.  She reports taking a B12 supplement daily by mouth.  After trying to schedule screening mammogram it was noted that the patient previously had an abnormal mammogram in 2008.  She reports she followed up with a surgeon and had no surgery.  She notes she was advised that everything was okay.  She has not had a mammogram since then   Social History   Tobacco Use  Smoking Status Never Smoker  Smokeless Tobacco Never Used     ROS see history of present illness  Objective  Physical Exam Vitals:   06/08/17 1322 06/08/17 1349  BP: (!) 164/80 140/74  Pulse: 83   Temp: 98.5 F (36.9 C)   SpO2: 95%     BP Readings from Last 3 Encounters:  06/08/17 140/74  09/08/16 126/78  07/14/16 128/82   Wt Readings from Last 3 Encounters:  06/08/17 (!) 347 lb (157.4 kg)  09/08/16 (!) 330 lb 12.8 oz (150 kg)  07/14/16 (!) 331 lb (150.1 kg)    Physical Exam  Constitutional: No distress.  Morbidly obese patient in wheelchair  Cardiovascular: Normal rate, regular rhythm and normal heart sounds.  Pulmonary/Chest: Effort normal and breath sounds normal.  Musculoskeletal: She exhibits no  edema.  No calf tenderness  Neurological: She is alert. Gait normal.  Skin: Skin is warm and dry. She is not diaphoretic.     Assessment/Plan: Please see individual problem list.  Morbid obesity ([place]) Encouraged increasing exercise with a goal of walking 100 feet per day.  She can increase from there.  Discussed decreasing diet soda.  History of DVT of lower extremity Doing well.  No evidence of recurrence.  She will continue on Eliquis.  Lab work today.  Urge incontinence of urine Suspect somewhat related to immobility though given urinary smell and urgency will check a UA.  Advised to decrease her diet soda intake and increase water intake.  B12 deficiency Check B12 today.  Abnormal mammogram History of this.  Repeat ordered.   Orders Placed This Encounter  Procedures  . Urine Culture  . MM SCREENING BREAST TOMO BILATERAL    Standing Status:   Future    Standing Expiration Date:   08/07/2018    Order Specific Question:   Reason for Exam (SYMPTOM  OR DIAGNOSIS REQUIRED)    Answer:   breast cancer screening    Order Specific Question:   Preferred imaging location?    Answer:   [facility]  . DG Bone Density    Standing Status:   Future    Standing Expiration Date:   08/07/2018    Order Specific Question:  Reason for Exam (SYMPTOM  OR DIAGNOSIS REQUIRED)    Answer:   osteoporosis screening    Order Specific Question:   Preferred imaging location?    Answer:   [facility]  . MM DIAG BREAST TOMO BILATERAL    Standing Status:   Future    Standing Expiration Date:   08/07/2018    Order Specific Question:   Reason for Exam (SYMPTOM  OR DIAGNOSIS REQUIRED)    Answer:   prior abnormal mammogram    Order Specific Question:   Preferred imaging location?    Answer:   [facility]  . US BREAST LTD UNI RIGHT INC AXILLA    Standing Status:   Future    Standing Expiration Date:   08/07/2018    Order Specific Question:   Reason for Exam (SYMPTOM  OR DIAGNOSIS  REQUIRED)    Answer:   prior abnormal mammo    Order Specific Question:   Preferred imaging location?    Answer:   [facility]  . US BREAST LTD UNI LEFT INC AXILLA    Standing Status:   Future    Standing Expiration Date:   08/07/2018    Order Specific Question:   Reason for Exam (SYMPTOM  OR DIAGNOSIS REQUIRED)    Answer:   prior abnormal mammo    Order Specific Question:   Preferred imaging location?    Answer:   [facility]  . Comp Met (CMET)  . CBC  . HgB A1c  . B12  . Urine Microscopic Only  . POCT Urinalysis Dipstick    No orders of the defined types were placed in this encounter.  Cologuard form signed for colon cancer screening.  Tommi Rumps, MD [place]

## 2017-06-11 ENCOUNTER — Telehealth: Payer: Self-pay | Admitting: Family Medicine

## 2017-06-11 ENCOUNTER — Other Ambulatory Visit: Payer: Self-pay | Admitting: Family Medicine

## 2017-06-11 DIAGNOSIS — D72829 Elevated white blood cell count, unspecified: Secondary | ICD-10-CM

## 2017-06-11 LAB — URINE CULTURE
MICRO NUMBER:: 90205497
SPECIMEN QUALITY:: ADEQUATE

## 2017-06-11 MED ORDER — CEPHALEXIN 500 MG PO CAPS: 500.0000 mg | ORAL_CAPSULE | Freq: Two times a day (BID) | ORAL | 0 refills | Status: DC

## 2017-06-11 NOTE — Telephone Encounter (Signed)
Explained labs to patients daughter

## 2017-06-11 NOTE — Telephone Encounter (Signed)
Copied from CRM [phone]. Topic: Quick Communication - See Telephone Encounter >> Jun 11, 2017  4:08 PM Arlyss Gandyichardson, Guelda Batson N, NT wrote: CRM for notification. See Telephone encounter for: Pt requesting a call from Dr. Purvis SheffieldSonnenberg's nurse. She had a few more questions about her labs.   06/11/17.

## 2017-06-18 ENCOUNTER — Other Ambulatory Visit: Payer: Medicare Other

## 2017-06-29 ENCOUNTER — Other Ambulatory Visit: Payer: Medicare Other

## 2017-07-05 ENCOUNTER — Other Ambulatory Visit: Payer: Medicare Other

## 2017-07-16 ENCOUNTER — Other Ambulatory Visit: Payer: Medicare Other

## 2017-07-20 ENCOUNTER — Other Ambulatory Visit: Payer: Medicare Other

## 2017-07-27 ENCOUNTER — Other Ambulatory Visit (INDEPENDENT_AMBULATORY_CARE_PROVIDER_SITE_OTHER): Payer: Medicare Other

## 2017-07-27 DIAGNOSIS — D72829 Elevated white blood cell count, unspecified: Secondary | ICD-10-CM | POA: Diagnosis not present

## 2017-07-27 LAB — CBC WITH DIFFERENTIAL/PLATELET
Basophils Absolute: 0.1 10*3/uL (ref 0.0–0.1)
Basophils Relative: 0.6 % (ref 0.0–3.0)
EOS ABS: 0.1 10*3/uL (ref 0.0–0.7)
EOS PCT: 1 % (ref 0.0–5.0)
HCT: 42.2 % (ref 36.0–46.0)
Hemoglobin: 13.9 g/dL (ref 12.0–15.0)
LYMPHS ABS: 1.8 10*3/uL (ref 0.7–4.0)
Lymphocytes Relative: 13.3 % (ref 12.0–46.0)
MCHC: 32.8 g/dL (ref 30.0–36.0)
MCV: 94.7 fl (ref 78.0–100.0)
MONO ABS: 0.7 10*3/uL (ref 0.1–1.0)
Monocytes Relative: 5.5 % (ref 3.0–12.0)
NEUTROS PCT: 79.6 % — AB (ref 43.0–77.0)
Neutro Abs: 10.5 10*3/uL — ABNORMAL HIGH (ref 1.4–7.7)
Platelets: 117 10*3/uL — ABNORMAL LOW (ref 150.0–400.0)
RBC: 4.46 Mil/uL (ref 3.87–5.11)
RDW: 14.6 % (ref 11.5–15.5)
WBC: 13.2 10*3/uL — AB (ref 4.0–10.5)

## 2017-07-30 ENCOUNTER — Other Ambulatory Visit: Payer: Self-pay | Admitting: Family Medicine

## 2017-07-30 DIAGNOSIS — D72829 Elevated white blood cell count, unspecified: Secondary | ICD-10-CM

## 2017-07-31 ENCOUNTER — Other Ambulatory Visit: Payer: Self-pay | Admitting: Family Medicine

## 2017-07-31 NOTE — Telephone Encounter (Signed)
Copied from CRM [phone]. Topic: Quick Communication - Rx Refill/Question >> Jul 31, 2017  1:59 PM Kathleen Barton, Kathleen Barton wrote: Medication: apixaban (ELIQUIS) 5 MG TABS tablet Has the patient contacted their pharmacy? Yes.  [facility] (Agent: If no, request that the patient contact the pharmacy for the refill.) Preferred Pharmacy (with phone number or street name): [facility] - [place] - [address] [phone] (Phone) [phone] (Fax)     Agent: Please be advised that RX refills may take up to 3 business days. We ask that you follow-up with your pharmacy.

## 2017-08-01 NOTE — Telephone Encounter (Signed)
Renea with [facility] calling in regards to the pts eliquis refill. She states they have been trying to get a refill for 4 weeks.

## 2017-08-02 ENCOUNTER — Other Ambulatory Visit: Payer: Self-pay | Admitting: Family Medicine

## 2017-08-02 NOTE — Telephone Encounter (Signed)
Patient calling to check status of the refill. States she is almost out.

## 2017-08-02 NOTE — Telephone Encounter (Signed)
Filled today

## 2017-08-03 ENCOUNTER — Other Ambulatory Visit (INDEPENDENT_AMBULATORY_CARE_PROVIDER_SITE_OTHER): Payer: Medicare Other

## 2017-08-03 ENCOUNTER — Other Ambulatory Visit: Payer: Medicare Other

## 2017-08-03 ENCOUNTER — Other Ambulatory Visit: Payer: Self-pay | Admitting: *Deleted

## 2017-08-03 ENCOUNTER — Telehealth: Payer: Self-pay | Admitting: Radiology

## 2017-08-03 DIAGNOSIS — D72829 Elevated white blood cell count, unspecified: Secondary | ICD-10-CM

## 2017-08-03 NOTE — Telephone Encounter (Signed)
[facility] called and stated pt CBC clotted and needed to be recollected.

## 2017-08-03 NOTE — Telephone Encounter (Signed)
Can you contact the patient to have her come back to repeat the labs? Thanks.

## 2017-08-03 NOTE — Telephone Encounter (Signed)
Unable to reach pt. No answer & no voicemail available. Will try again on Monday.

## 2017-08-06 ENCOUNTER — Telehealth: Payer: Self-pay | Admitting: *Deleted

## 2017-08-06 DIAGNOSIS — D72829 Elevated white blood cell count, unspecified: Secondary | ICD-10-CM

## 2017-08-06 NOTE — Telephone Encounter (Signed)
Lab order placed & lab appt scheduled to redrawn CBC (send 1 lavender & 1 blue top tube)

## 2017-08-06 NOTE — Telephone Encounter (Signed)
Pt notified & rescheduled lab appt & lab order placed.

## 2017-08-15 ENCOUNTER — Other Ambulatory Visit: Payer: Medicare Other

## 2017-08-17 ENCOUNTER — Telehealth: Payer: Self-pay | Admitting: Radiology

## 2017-08-17 ENCOUNTER — Other Ambulatory Visit (INDEPENDENT_AMBULATORY_CARE_PROVIDER_SITE_OTHER): Payer: Medicare Other

## 2017-08-17 DIAGNOSIS — D72829 Elevated white blood cell count, unspecified: Secondary | ICD-10-CM | POA: Diagnosis not present

## 2017-08-17 NOTE — Telephone Encounter (Signed)
Pt came in for labs today. Purple and blue top were sent per Clydie BraunKaren from [facility] due to prior CBC tube rejected due to clumping. [facility] called and stated unable to run pt CBC from purple top or blue top tube. Advised Hope that Clydie BraunKaren stated to do so. [facility] stated still unable to run. Please advise.

## 2017-08-17 NOTE — Telephone Encounter (Signed)
We may need to have her go to the hospital lab to have her blood work drawn.  Please see if she is willing to do that.  Thanks.

## 2017-08-17 NOTE — Telephone Encounter (Signed)
Patient notified and will go to the hospital

## 2017-08-17 NOTE — Telephone Encounter (Signed)
See prev note

## 2017-08-19 NOTE — Telephone Encounter (Signed)
CBC has been ordered

## 2017-08-19 NOTE — Addendum Note (Signed)
Addended by: Birdie Sons Giulian Goldring G on: 08/19/2017 01:15 PM   Modules accepted: Orders

## 2017-08-24 ENCOUNTER — Other Ambulatory Visit: Payer: Medicare Other

## 2017-08-29 ENCOUNTER — Other Ambulatory Visit: Payer: Self-pay

## 2017-08-29 ENCOUNTER — Other Ambulatory Visit: Payer: Self-pay | Admitting: Family Medicine

## 2017-08-29 MED ORDER — PAROXETINE HCL 30 MG PO TABS: 30.0000 mg | ORAL_TABLET | Freq: Every day | ORAL | 3 refills | Status: DC

## 2017-09-07 ENCOUNTER — Ambulatory Visit: Payer: Medicare Other | Admitting: Family Medicine

## 2017-09-10 ENCOUNTER — Ambulatory Visit: Payer: Medicare Other

## 2017-09-28 ENCOUNTER — Other Ambulatory Visit: Payer: Medicare Other

## 2017-10-11 ENCOUNTER — Telehealth: Payer: Self-pay

## 2017-10-11 ENCOUNTER — Other Ambulatory Visit: Payer: Self-pay

## 2017-10-11 NOTE — Telephone Encounter (Signed)
Lm for pt to call back and schedule when it is a good time for her

## 2017-10-11 NOTE — Telephone Encounter (Signed)
Copied from CRM [id number]. Topic: Quick Communication - See Telephone Encounter >> Oct 11, 2017  1:22 PM Raquel SarnaHayes, Teresa G wrote: Pt had to cancel AWV on July 12th.  Please call pt back to reschedule appt.

## 2017-10-17 ENCOUNTER — Other Ambulatory Visit: Payer: Medicare Other

## 2017-10-26 ENCOUNTER — Ambulatory Visit: Payer: Medicare Other | Admitting: Family Medicine

## 2017-11-02 ENCOUNTER — Ambulatory Visit: Payer: Medicare Other

## 2017-11-28 ENCOUNTER — Other Ambulatory Visit: Payer: Self-pay

## 2017-11-28 MED ORDER — APIXABAN 5 MG PO TABS: 5.0000 mg | ORAL_TABLET | Freq: Two times a day (BID) | ORAL | 3 refills | Status: DC

## 2017-12-21 ENCOUNTER — Ambulatory Visit: Payer: Medicare Other | Admitting: Family Medicine

## 2017-12-25 ENCOUNTER — Ambulatory Visit: Payer: Medicare Other | Admitting: Family Medicine

## 2017-12-27 ENCOUNTER — Other Ambulatory Visit: Payer: Self-pay | Admitting: Family Medicine

## 2017-12-27 MED ORDER — ATENOLOL-CHLORTHALIDONE 50-25 MG PO TABS: 1.0000 | ORAL_TABLET | Freq: Every day | ORAL | 0 refills | Status: DC

## 2017-12-27 NOTE — Telephone Encounter (Signed)
Copied from CRM [id number]. Topic: Quick Communication - Rx Refill/Question >> Dec 27, 2017  2:16 PM Beryle Beams, Joyce Copa wrote: Medication:atenolol-chlorthalidone (TENORETIC) 50-25 MG tablet [[id number]]   Has the patient contacted their pharmacy? Yes  Preferred Pharmacy (with phone number or street name): [facility] - [place], [place] - [place] - [address] [phone] (Phone) [phone] (Fax)   Agent: Please be advised that RX refills may take up to 3 business days. We ask that you follow-up with your pharmacy.

## 2018-01-04 ENCOUNTER — Other Ambulatory Visit: Payer: Medicare Other

## 2018-01-09 ENCOUNTER — Other Ambulatory Visit: Payer: Medicare Other

## 2018-01-25 ENCOUNTER — Other Ambulatory Visit: Payer: Self-pay | Admitting: Family Medicine

## 2018-01-25 MED ORDER — PAROXETINE HCL 30 MG PO TABS: 30.0000 mg | ORAL_TABLET | Freq: Every day | ORAL | 1 refills | Status: DC

## 2018-01-25 MED ORDER — ATENOLOL-CHLORTHALIDONE 50-25 MG PO TABS: 1.0000 | ORAL_TABLET | Freq: Every day | ORAL | 0 refills | Status: DC

## 2018-01-25 NOTE — Telephone Encounter (Signed)
Courtesy refill given until appt on 03/08/18  Requested Prescriptions  Pending Prescriptions Disp Refills   atenolol-chlorthalidone (TENORETIC) 50-25 MG tablet 60 tablet 0    Sig: Take 1 tablet by mouth daily. for blood pressure     Cardiovascular: Beta Blocker + Diuretic Combos Failed - 01/25/2018 11:30 AM      Failed - K in normal range and within 180 days    Potassium  Date Value Ref Range Status  06/08/2017 3.7 3.5 - 5.1 mEq/L Final         Failed - Na in normal range and within 180 days    Sodium  Date Value Ref Range Status  06/08/2017 142 135 - 145 mEq/L Final         Failed - Cr in normal range and within 180 days    Creatinine, Ser  Date Value Ref Range Status  06/08/2017 1.05 0.40 - 1.20 mg/dL Final         Failed - Ca in normal range and within 180 days    Calcium  Date Value Ref Range Status  06/08/2017 9.6 8.4 - 10.5 mg/dL Final         Failed - Last BP in normal range    BP Readings from Last 1 Encounters:  06/08/17 140/74         Failed - Valid encounter within last 6 months    Recent Outpatient Visits          7 months ago Essential hypertension   [facility] [place], Yehuda Mao, MD   1 year ago Morbid obesity [facility])   [facility] [place] Glori Luis, MD   1 year ago Essential hypertension   [facility] [place] Glori Luis, MD   2 years ago B12 deficiency   [facility] Glori Luis, MD   2 years ago Essential hypertension   [facility] [place], Yehuda Mao, MD      Future Appointments            In 1 month Birdie Sons, Yehuda Mao, MD [facility] [place], [facility]           Passed - Patient is not pregnant      Passed - Last Heart Rate in normal range    Pulse Readings from Last 1 Encounters:  06/08/17 83        PARoxetine (PAXIL) 30 MG tablet 30 tablet 1    Sig: Take 1 tablet (30 mg total) by mouth daily.     Psychiatry:   Antidepressants - SSRI Failed - 01/25/2018 11:30 AM      Failed - Valid encounter within last 6 months    Recent Outpatient Visits          7 months ago Essential hypertension   [facility] [place], Yehuda Mao, MD   1 year ago Morbid obesity [facility])   [facility] [place] Glori Luis, MD   1 year ago Essential hypertension    [facility] [place] Glori Luis, MD   2 years ago B12 deficiency   [facility] Birdie Sons, Yehuda Mao, MD   2 years ago Essential hypertension   [facility] [place], Yehuda Mao, MD      Future Appointments            In 1 month Birdie Sons, Yehuda Mao, MD [facility], [facility]

## 2018-01-25 NOTE — Telephone Encounter (Signed)
Copied from CRM [id number]. Topic: Quick Communication - Rx Refill/Question >> Jan 25, 2018 11:17 AM Herby Abraham C wrote: Medication: PARoxetine (PAXIL) 30 MG tablet and also atenolol-chlorthalidone (TENORETIC) 50-25 MG tablet   Has the patient contacted their pharmacy? Yes  (Agent: If no, request that the patient contact the pharmacy for the refill.) (Agent: If yes, when and what did the pharmacy advise?)  Preferred Pharmacy (with phone number or street name): [facility] - [place], [place] - [address] [phone] (Phone) [phone] (Fax)    Agent: Please be advised that RX refills may take up to 3 business days. We ask that you follow-up with your pharmacy.

## 2018-03-08 ENCOUNTER — Ambulatory Visit: Payer: Medicare Other | Admitting: Family Medicine

## 2018-03-29 ENCOUNTER — Other Ambulatory Visit: Payer: Self-pay | Admitting: Family Medicine

## 2018-03-29 MED ORDER — APIXABAN 5 MG PO TABS: 5.0000 mg | ORAL_TABLET | Freq: Two times a day (BID) | ORAL | 0 refills | Status: DC

## 2018-03-29 MED ORDER — ATENOLOL-CHLORTHALIDONE 50-25 MG PO TABS: 1.0000 | ORAL_TABLET | Freq: Every day | ORAL | 0 refills | Status: DC

## 2018-03-29 MED ORDER — PAROXETINE HCL 30 MG PO TABS: 30.0000 mg | ORAL_TABLET | Freq: Every day | ORAL | 0 refills | Status: DC

## 2018-03-29 NOTE — Telephone Encounter (Signed)
Copied from CRM [id number] [id number]. Topic: Quick Communication - Rx Refill/Question >> Mar 29, 2018 10:59 AM Percival SpanishKennedy, Cheryl W wrote: Medication   apixaban (ELIQUIS) 5 MG TABS tablet   atenolol-chlorthalidone (TENORETIC) 50-25 MG tablet      Pt is out of medicine per the pharmacy    PARoxetine (PAXIL) 30 MG tablet   Pharmacy  [facility]            Agent: Please be advised that RX refills may take up to 3 business days. We ask that you follow-up with your pharmacy.

## 2018-03-29 NOTE — Telephone Encounter (Signed)
Requested Prescriptions  Pending Prescriptions Disp Refills  . apixaban (ELIQUIS) 5 MG TABS tablet 180 tablet 0    Sig: Take 1 tablet (5 mg total) by mouth 2 (two) times daily.     Hematology:  Anticoagulants Failed - 03/29/2018  1:14 PM      Failed - PLT in normal range and within 360 days    Platelets  Date Value Ref Range Status  07/27/2017 117.0 Repeated and verified X2. (L) 150.0 - 400.0 K/uL Final         Passed - HGB in normal range and within 360 days    Hemoglobin  Date Value Ref Range Status  07/27/2017 13.9 12.0 - 15.0 g/dL Final         Passed - HCT in normal range and within 360 days    HCT  Date Value Ref Range Status  07/27/2017 42.2 36.0 - 46.0 % Final         Passed - Cr in normal range and within 360 days    Creatinine, Ser  Date Value Ref Range Status  06/08/2017 1.05 0.40 - 1.20 mg/dL Final         Passed - Valid encounter within last 12 months    Recent Outpatient Visits          9 months ago Essential hypertension   [facility] [place], Yehuda MaoEric G, MD   1 year ago Morbid obesity [facility])   [facility] [place] Glori LuisSonnenberg, Eric G, MD   2 years ago Essential hypertension   [facility] [place] Glori LuisSonnenberg, Eric G, MD   2 years ago B12 deficiency   [facility] [place] Glori LuisSonnenberg, Eric G, MD   2 years ago Essential hypertension   [facility] [place], Yehuda MaoEric G, MD      Future Appointments            In 2 months Birdie SonsSonnenberg, Yehuda MaoEric G, MD [facility] [place]         . atenolol-chlorthalidone (TENORETIC) 50-25 MG tablet 60 tablet 0    Sig: Take 1 tablet by mouth daily. for blood pressure     Cardiovascular: Beta Blocker + Diuretic Combos Failed - 03/29/2018  1:14 PM      Failed - K in normal range and within 180 days    Potassium  Date Value Ref Range Status  06/08/2017 3.7 3.5 - 5.1 mEq/L Final         Failed - Na in normal range and within 180 days   Sodium  Date Value Ref Range Status  06/08/2017 142 135 - 145 mEq/L Final         Failed - Cr in normal range and within 180 days    Creatinine, Ser  Date Value Ref Range Status  06/08/2017 1.05 0.40 - 1.20 mg/dL Final         Failed - Ca in normal range and within 180 days    Calcium  Date Value Ref Range Status  06/08/2017 9.6 8.4 - 10.5 mg/dL Final         Failed - Last BP in normal range    BP Readings from Last 1 Encounters:  06/08/17 140/74         Failed - Valid encounter within last 6 months    Recent Outpatient Visits          9 months ago Essential hypertension   [facility] [place], Yehuda MaoEric G, MD  1 year ago Morbid obesity [facility])   [facility] [place], Yehuda Mao, MD   2 years ago Essential hypertension   [facility] [place], Yehuda Mao, MD   2 years ago B12 deficiency   [facility] Birdie Sons, Yehuda Mao, MD   2 years ago Essential hypertension   [facility] [place], Yehuda Mao, MD      Future Appointments            In 2 months Birdie Sons, Yehuda Mao, MD [facility], [facility]           Passed - Patient is not pregnant      Passed - Last Heart Rate in normal range    Pulse Readings from Last 1 Encounters:  06/08/17 83       . PARoxetine (PAXIL) 30 MG tablet 30 tablet 1    Sig: Take 1 tablet (30 mg total) by mouth daily.     Psychiatry:  Antidepressants - SSRI Failed - 03/29/2018  1:14 PM      Failed - Valid encounter within last 6 months    Recent Outpatient Visits          9 months ago Essential hypertension   [facility] [place], Yehuda Mao, MD   1 year ago Morbid obesity [facility])   [facility] [place] Glori Luis, MD   2 years ago Essential hypertension   [facility] [place] Glori Luis, MD   2 years ago B12 deficiency   [facility] Glori Luis, MD   2  years ago Essential hypertension   [facility] [place] Glori Luis, MD      Future Appointments            In 2 months Birdie Sons, Yehuda Mao, MD [facility], [facility]         Appt made for 06/14/18- filling until appt

## 2018-04-08 ENCOUNTER — Other Ambulatory Visit: Payer: Self-pay

## 2018-04-15 ENCOUNTER — Ambulatory Visit: Payer: Medicare Other | Admitting: Family Medicine

## 2018-06-04 ENCOUNTER — Telehealth: Payer: Self-pay

## 2018-06-04 NOTE — Telephone Encounter (Signed)
Noted.  We will await her call back requesting a refill.

## 2018-06-04 NOTE — Telephone Encounter (Signed)
Copied from CRM [phone]. Topic: General - Inquiry >> Jun 04, 2018  3:16 PM Crist InfanteHarrald, Kathy J wrote: Reason for CRM: pt had to cancel her 06/14/18 due to her ride having to go out of town, so she has no way here. Pt states she will need a 30 day refill in March to get through to this appt. But pt will call back before she needs to ask for the refills.

## 2018-06-04 NOTE — Telephone Encounter (Signed)
Sent to PCP as an FYI  

## 2018-06-14 ENCOUNTER — Ambulatory Visit: Payer: Medicare Other

## 2018-06-14 ENCOUNTER — Ambulatory Visit: Payer: Medicare Other | Admitting: Family Medicine

## 2018-06-27 ENCOUNTER — Other Ambulatory Visit: Payer: Self-pay | Admitting: Family Medicine

## 2018-06-27 NOTE — Telephone Encounter (Signed)
Routing to PCP. Patient has already been provided courtesy refills. Appointment has been scheduled for 07/26/18. Multiple appointments have been cancelled by patient.

## 2018-06-27 NOTE — Telephone Encounter (Signed)
Copied from CRM [id number]. Topic: Quick Communication - Rx Refill/Question >> Jun 27, 2018 10:08 AM Baldo Daub L wrote: Medication:  apixaban (ELIQUIS) 5 MG TABS tablet PARoxetine (PAXIL) 30 MG tablet atenolol-chlorthalidone (TENORETIC) 50-25 MG tablet  Has the patient contacted their pharmacy? Yes - pharmacy is reaching out states pt has been out for a few days and they have been trying to get this filled for over a month. (Agent: If no, request that the patient contact the pharmacy for the refill.) (Agent: If yes, when and what did the pharmacy advise?)  Preferred Pharmacy (with phone number or street name): [facility] - [place], [place] - [address] [phone] (Phone) [phone] (Fax)  Agent: Please be advised that RX refills may take up to 3 business days. We ask that you follow-up with your pharmacy.

## 2018-06-27 NOTE — Telephone Encounter (Signed)
See refill request. Patient already provided courtesy refill.

## 2018-06-28 MED ORDER — APIXABAN 5 MG PO TABS: 5.0000 mg | ORAL_TABLET | Freq: Two times a day (BID) | ORAL | 0 refills | Status: DC

## 2018-06-28 MED ORDER — PAROXETINE HCL 30 MG PO TABS: 30.0000 mg | ORAL_TABLET | Freq: Every day | ORAL | 0 refills | Status: DC

## 2018-06-28 MED ORDER — ATENOLOL-CHLORTHALIDONE 50-25 MG PO TABS: 1.0000 | ORAL_TABLET | Freq: Every day | ORAL | 0 refills | Status: DC

## 2018-06-28 NOTE — Telephone Encounter (Signed)
She has an appt scheduled with pcp Refills provided for 30 days

## 2018-07-05 DIAGNOSIS — M1990 Unspecified osteoarthritis, unspecified site: Secondary | ICD-10-CM | POA: Diagnosis not present

## 2018-07-05 DIAGNOSIS — R269 Unspecified abnormalities of gait and mobility: Secondary | ICD-10-CM | POA: Diagnosis not present

## 2018-07-05 DIAGNOSIS — I82A21 Chronic embolism and thrombosis of right axillary vein: Secondary | ICD-10-CM | POA: Diagnosis not present

## 2018-07-05 DIAGNOSIS — I10 Essential (primary) hypertension: Secondary | ICD-10-CM | POA: Diagnosis not present

## 2018-07-11 DIAGNOSIS — Z7901 Long term (current) use of anticoagulants: Secondary | ICD-10-CM | POA: Diagnosis not present

## 2018-07-11 DIAGNOSIS — I10 Essential (primary) hypertension: Secondary | ICD-10-CM | POA: Diagnosis not present

## 2018-07-11 DIAGNOSIS — Z9181 History of falling: Secondary | ICD-10-CM | POA: Diagnosis not present

## 2018-07-11 DIAGNOSIS — I82A21 Chronic embolism and thrombosis of right axillary vein: Secondary | ICD-10-CM | POA: Diagnosis not present

## 2018-07-11 DIAGNOSIS — M19012 Primary osteoarthritis, left shoulder: Secondary | ICD-10-CM | POA: Diagnosis not present

## 2018-07-11 DIAGNOSIS — M19011 Primary osteoarthritis, right shoulder: Secondary | ICD-10-CM | POA: Diagnosis not present

## 2018-07-11 DIAGNOSIS — R262 Difficulty in walking, not elsewhere classified: Secondary | ICD-10-CM | POA: Diagnosis not present

## 2018-07-16 DIAGNOSIS — I82A21 Chronic embolism and thrombosis of right axillary vein: Secondary | ICD-10-CM | POA: Diagnosis not present

## 2018-07-16 DIAGNOSIS — M19011 Primary osteoarthritis, right shoulder: Secondary | ICD-10-CM | POA: Diagnosis not present

## 2018-07-16 DIAGNOSIS — M19012 Primary osteoarthritis, left shoulder: Secondary | ICD-10-CM | POA: Diagnosis not present

## 2018-07-16 DIAGNOSIS — Z9181 History of falling: Secondary | ICD-10-CM | POA: Diagnosis not present

## 2018-07-16 DIAGNOSIS — Z7901 Long term (current) use of anticoagulants: Secondary | ICD-10-CM | POA: Diagnosis not present

## 2018-07-16 DIAGNOSIS — R262 Difficulty in walking, not elsewhere classified: Secondary | ICD-10-CM | POA: Diagnosis not present

## 2018-07-16 DIAGNOSIS — I10 Essential (primary) hypertension: Secondary | ICD-10-CM | POA: Diagnosis not present

## 2018-07-17 DIAGNOSIS — R262 Difficulty in walking, not elsewhere classified: Secondary | ICD-10-CM | POA: Diagnosis not present

## 2018-07-17 DIAGNOSIS — Z7901 Long term (current) use of anticoagulants: Secondary | ICD-10-CM | POA: Diagnosis not present

## 2018-07-17 DIAGNOSIS — M19011 Primary osteoarthritis, right shoulder: Secondary | ICD-10-CM | POA: Diagnosis not present

## 2018-07-17 DIAGNOSIS — I82A21 Chronic embolism and thrombosis of right axillary vein: Secondary | ICD-10-CM | POA: Diagnosis not present

## 2018-07-17 DIAGNOSIS — I10 Essential (primary) hypertension: Secondary | ICD-10-CM | POA: Diagnosis not present

## 2018-07-17 DIAGNOSIS — M19012 Primary osteoarthritis, left shoulder: Secondary | ICD-10-CM | POA: Diagnosis not present

## 2018-07-17 DIAGNOSIS — Z9181 History of falling: Secondary | ICD-10-CM | POA: Diagnosis not present

## 2018-07-19 DIAGNOSIS — Z9181 History of falling: Secondary | ICD-10-CM | POA: Diagnosis not present

## 2018-07-19 DIAGNOSIS — M19012 Primary osteoarthritis, left shoulder: Secondary | ICD-10-CM | POA: Diagnosis not present

## 2018-07-19 DIAGNOSIS — Z7901 Long term (current) use of anticoagulants: Secondary | ICD-10-CM | POA: Diagnosis not present

## 2018-07-19 DIAGNOSIS — R262 Difficulty in walking, not elsewhere classified: Secondary | ICD-10-CM | POA: Diagnosis not present

## 2018-07-19 DIAGNOSIS — I82A21 Chronic embolism and thrombosis of right axillary vein: Secondary | ICD-10-CM | POA: Diagnosis not present

## 2018-07-19 DIAGNOSIS — I10 Essential (primary) hypertension: Secondary | ICD-10-CM | POA: Diagnosis not present

## 2018-07-19 DIAGNOSIS — M19011 Primary osteoarthritis, right shoulder: Secondary | ICD-10-CM | POA: Diagnosis not present

## 2018-07-22 DIAGNOSIS — R262 Difficulty in walking, not elsewhere classified: Secondary | ICD-10-CM | POA: Diagnosis not present

## 2018-07-22 DIAGNOSIS — I10 Essential (primary) hypertension: Secondary | ICD-10-CM | POA: Diagnosis not present

## 2018-07-22 DIAGNOSIS — Z9181 History of falling: Secondary | ICD-10-CM | POA: Diagnosis not present

## 2018-07-22 DIAGNOSIS — M19011 Primary osteoarthritis, right shoulder: Secondary | ICD-10-CM | POA: Diagnosis not present

## 2018-07-22 DIAGNOSIS — I82A21 Chronic embolism and thrombosis of right axillary vein: Secondary | ICD-10-CM | POA: Diagnosis not present

## 2018-07-22 DIAGNOSIS — Z7901 Long term (current) use of anticoagulants: Secondary | ICD-10-CM | POA: Diagnosis not present

## 2018-07-22 DIAGNOSIS — M19012 Primary osteoarthritis, left shoulder: Secondary | ICD-10-CM | POA: Diagnosis not present

## 2018-07-23 DIAGNOSIS — I10 Essential (primary) hypertension: Secondary | ICD-10-CM | POA: Diagnosis not present

## 2018-07-23 DIAGNOSIS — R262 Difficulty in walking, not elsewhere classified: Secondary | ICD-10-CM | POA: Diagnosis not present

## 2018-07-23 DIAGNOSIS — M19011 Primary osteoarthritis, right shoulder: Secondary | ICD-10-CM | POA: Diagnosis not present

## 2018-07-23 DIAGNOSIS — I82A21 Chronic embolism and thrombosis of right axillary vein: Secondary | ICD-10-CM | POA: Diagnosis not present

## 2018-07-23 DIAGNOSIS — Z7901 Long term (current) use of anticoagulants: Secondary | ICD-10-CM | POA: Diagnosis not present

## 2018-07-23 DIAGNOSIS — Z9181 History of falling: Secondary | ICD-10-CM | POA: Diagnosis not present

## 2018-07-23 DIAGNOSIS — M19012 Primary osteoarthritis, left shoulder: Secondary | ICD-10-CM | POA: Diagnosis not present

## 2018-07-24 DIAGNOSIS — M19012 Primary osteoarthritis, left shoulder: Secondary | ICD-10-CM | POA: Diagnosis not present

## 2018-07-24 DIAGNOSIS — I82A21 Chronic embolism and thrombosis of right axillary vein: Secondary | ICD-10-CM | POA: Diagnosis not present

## 2018-07-24 DIAGNOSIS — R262 Difficulty in walking, not elsewhere classified: Secondary | ICD-10-CM | POA: Diagnosis not present

## 2018-07-24 DIAGNOSIS — M19011 Primary osteoarthritis, right shoulder: Secondary | ICD-10-CM | POA: Diagnosis not present

## 2018-07-24 DIAGNOSIS — I10 Essential (primary) hypertension: Secondary | ICD-10-CM | POA: Diagnosis not present

## 2018-07-24 DIAGNOSIS — Z9181 History of falling: Secondary | ICD-10-CM | POA: Diagnosis not present

## 2018-07-24 DIAGNOSIS — Z7901 Long term (current) use of anticoagulants: Secondary | ICD-10-CM | POA: Diagnosis not present

## 2018-07-25 DIAGNOSIS — I10 Essential (primary) hypertension: Secondary | ICD-10-CM | POA: Diagnosis not present

## 2018-07-25 DIAGNOSIS — Z7901 Long term (current) use of anticoagulants: Secondary | ICD-10-CM | POA: Diagnosis not present

## 2018-07-25 DIAGNOSIS — M19011 Primary osteoarthritis, right shoulder: Secondary | ICD-10-CM | POA: Diagnosis not present

## 2018-07-25 DIAGNOSIS — M19012 Primary osteoarthritis, left shoulder: Secondary | ICD-10-CM | POA: Diagnosis not present

## 2018-07-25 DIAGNOSIS — Z9181 History of falling: Secondary | ICD-10-CM | POA: Diagnosis not present

## 2018-07-25 DIAGNOSIS — I82A21 Chronic embolism and thrombosis of right axillary vein: Secondary | ICD-10-CM | POA: Diagnosis not present

## 2018-07-25 DIAGNOSIS — R262 Difficulty in walking, not elsewhere classified: Secondary | ICD-10-CM | POA: Diagnosis not present

## 2018-07-26 ENCOUNTER — Ambulatory Visit (INDEPENDENT_AMBULATORY_CARE_PROVIDER_SITE_OTHER): Payer: Medicare Other | Admitting: Family Medicine

## 2018-07-26 ENCOUNTER — Telehealth: Payer: Self-pay | Admitting: Family Medicine

## 2018-07-26 ENCOUNTER — Other Ambulatory Visit: Payer: Self-pay

## 2018-07-26 ENCOUNTER — Encounter: Payer: Self-pay | Admitting: Family Medicine

## 2018-07-26 DIAGNOSIS — F329 Major depressive disorder, single episode, unspecified: Secondary | ICD-10-CM | POA: Insufficient documentation

## 2018-07-26 DIAGNOSIS — R928 Other abnormal and inconclusive findings on diagnostic imaging of breast: Secondary | ICD-10-CM | POA: Diagnosis not present

## 2018-07-26 DIAGNOSIS — F419 Anxiety disorder, unspecified: Secondary | ICD-10-CM

## 2018-07-26 DIAGNOSIS — D72829 Elevated white blood cell count, unspecified: Secondary | ICD-10-CM

## 2018-07-26 DIAGNOSIS — I10 Essential (primary) hypertension: Secondary | ICD-10-CM | POA: Diagnosis not present

## 2018-07-26 DIAGNOSIS — F32A Depression, unspecified: Secondary | ICD-10-CM | POA: Insufficient documentation

## 2018-07-26 DIAGNOSIS — Z86718 Personal history of other venous thrombosis and embolism: Secondary | ICD-10-CM

## 2018-07-26 DIAGNOSIS — Z87898 Personal history of other specified conditions: Secondary | ICD-10-CM | POA: Insufficient documentation

## 2018-07-26 MED ORDER — PAROXETINE HCL 30 MG PO TABS: 30.0000 mg | ORAL_TABLET | Freq: Every day | ORAL | 0 refills | Status: DC

## 2018-07-26 MED ORDER — APIXABAN 5 MG PO TABS: 5.0000 mg | ORAL_TABLET | Freq: Two times a day (BID) | ORAL | 0 refills | Status: DC

## 2018-07-26 MED ORDER — ATENOLOL-CHLORTHALIDONE 50-25 MG PO TABS: 1.0000 | ORAL_TABLET | Freq: Every day | ORAL | 0 refills | Status: DC

## 2018-07-26 NOTE — Telephone Encounter (Signed)
Please contact the patient and get her set up for lab work at the end of April.  She will also need an in office follow-up in 4 months.  Thanks.

## 2018-07-26 NOTE — Assessment & Plan Note (Signed)
Encouraged monitoring her diet and staying active.

## 2018-07-26 NOTE — Telephone Encounter (Signed)
Called and spoke with pt. Pt has been scheduled for lab work in May per request by pt. May 15 @ 2:30 PM. $ month f/u scheduled for July 31 @ 4:00 PM.

## 2018-07-26 NOTE — Assessment & Plan Note (Signed)
Asymptomatic.  Patient wants to stay on Paxil.

## 2018-07-26 NOTE — Assessment & Plan Note (Signed)
History of and this in the past.  Patient reports that she followed with the surgeon for quite some time regarding this and no surgery was needed.  Discussed that we will need to plan on obtaining a mammogram for screening purposes once the coronavirus issue is improving.

## 2018-07-26 NOTE — Assessment & Plan Note (Signed)
Currently asymptomatic.  She has benefited from meclizine in the past.

## 2018-07-26 NOTE — Assessment & Plan Note (Signed)
Well-controlled per patient report.  She will continue current regimen.  We will have her come in for lab work.

## 2018-07-26 NOTE — Assessment & Plan Note (Signed)
Asymptomatic.  Continue Eliquis.

## 2018-07-26 NOTE — Assessment & Plan Note (Signed)
Recheck white blood cell count with labs at the end of April.

## 2018-07-26 NOTE — Progress Notes (Addendum)
Virtual Visit via Telephone Note  This visit type was conducted due to national recommendations for restrictions regarding the COVID-19 pandemic (e.g. social distancing).  This format is felt to be most appropriate for this patient at this time.  All issues noted in this document were discussed and addressed.  No physical exam was performed (except for noted visual exam findings with Video Visits).    I connected with Kathleen Barton on 07/26/18 at  9:00 AM EDT by telephone and verified that I am speaking with the correct person using two identifiers.   I discussed the limitations, risks, security and privacy concerns of performing an evaluation and management service by telephone and the availability of in person appointments. I also discussed with the patient that there may be a patient responsible charge related to this service. The patient expressed understanding and agreed to proceed.  The patient Kathleen Barton) was at home and I Marikay Alar) was at work.  Interactive audio and video telecommunications were attempted between this provider and patient, however failed, due to patient having technical difficulties OR patient did not have access to video capability.  We continued and completed visit with audio only.    Reason for visit: Med refill follow-up.  History of Present Illness: History of DVT: Patient remains on Eliquis.  She has had no bleeding issues.  No shortness of breath, chest pain, or swelling.  Anxiety/depression: Patient notes no anxiety or depression.  She remains on Paxil and has been on this for a long time.  She wants to remain on this.  Hypertension: Typically in the 120s over 70s.  Taking atenolol/chlorthalidone.  No chest pain, shortness of breath, or edema.  History of vertigo: Patient has not had any vertigo symptoms lately.  She has not required meclizine recently.  History of abnormal mammogram: Patient reports she saw general surgery for a long time and they  were following the abnormal lesion that was noted on mammogram in 2008.  She notes no surgery was needed and she stopped following with them.  Obesity: The patient has been walking some for exercise.  She is eating mostly vegetables.  No junk or sweets.  No soda or sweet tea.   Observations/Objective: No physical exam was completed given that this was a telehealth visit.  Assessment and Plan: Essential hypertension Well-controlled per patient report.  She will continue current regimen.  We will have her come in for lab work.  Abnormal mammogram History of and this in the past.  Patient reports that she followed with the surgeon for quite some time regarding this and no surgery was needed.  Discussed that we will need to plan on obtaining a mammogram for screening purposes once the coronavirus issue is improving.  History of DVT of lower extremity Asymptomatic.  Continue Eliquis.  Leukocytosis Recheck white blood cell count with labs at the end of April.  Anxiety and depression Asymptomatic.  Patient wants to stay on Paxil.  History of vertigo Currently asymptomatic.  She has benefited from meclizine in the past.  Morbid obesity (HCC) Encouraged monitoring her diet and staying active.    Follow Up Instructions: Patient will be contacted by CMA to schedule lab work in late April.  She will follow-up in the office in about 4 months.   I discussed the assessment and treatment plan with the patient. The patient was provided an opportunity to ask questions and all were answered. The patient agreed with the plan and demonstrated an understanding of the  instructions.   The patient was advised to call back or seek an in-person evaluation if the symptoms worsen or if the condition fails to improve as anticipated.  I provided 12 minutes of non-face-to-face time during this encounter.   Marikay Alar, MD

## 2018-07-30 DIAGNOSIS — R262 Difficulty in walking, not elsewhere classified: Secondary | ICD-10-CM | POA: Diagnosis not present

## 2018-07-30 DIAGNOSIS — I82A21 Chronic embolism and thrombosis of right axillary vein: Secondary | ICD-10-CM | POA: Diagnosis not present

## 2018-07-30 DIAGNOSIS — Z9181 History of falling: Secondary | ICD-10-CM | POA: Diagnosis not present

## 2018-07-30 DIAGNOSIS — M19012 Primary osteoarthritis, left shoulder: Secondary | ICD-10-CM | POA: Diagnosis not present

## 2018-07-30 DIAGNOSIS — I10 Essential (primary) hypertension: Secondary | ICD-10-CM | POA: Diagnosis not present

## 2018-07-30 DIAGNOSIS — M19011 Primary osteoarthritis, right shoulder: Secondary | ICD-10-CM | POA: Diagnosis not present

## 2018-07-30 DIAGNOSIS — Z7901 Long term (current) use of anticoagulants: Secondary | ICD-10-CM | POA: Diagnosis not present

## 2018-08-01 DIAGNOSIS — R262 Difficulty in walking, not elsewhere classified: Secondary | ICD-10-CM | POA: Diagnosis not present

## 2018-08-01 DIAGNOSIS — Z7901 Long term (current) use of anticoagulants: Secondary | ICD-10-CM | POA: Diagnosis not present

## 2018-08-01 DIAGNOSIS — Z9181 History of falling: Secondary | ICD-10-CM | POA: Diagnosis not present

## 2018-08-01 DIAGNOSIS — I10 Essential (primary) hypertension: Secondary | ICD-10-CM | POA: Diagnosis not present

## 2018-08-01 DIAGNOSIS — M19012 Primary osteoarthritis, left shoulder: Secondary | ICD-10-CM | POA: Diagnosis not present

## 2018-08-01 DIAGNOSIS — I82A21 Chronic embolism and thrombosis of right axillary vein: Secondary | ICD-10-CM | POA: Diagnosis not present

## 2018-08-01 DIAGNOSIS — M19011 Primary osteoarthritis, right shoulder: Secondary | ICD-10-CM | POA: Diagnosis not present

## 2018-09-06 ENCOUNTER — Other Ambulatory Visit: Payer: Medicare Other

## 2018-09-06 NOTE — Addendum Note (Signed)
Addended by: Gerrod Maule S on: 09/06/2018 02:37 PM   Modules accepted: Orders  

## 2018-11-22 ENCOUNTER — Other Ambulatory Visit: Payer: Self-pay

## 2018-11-22 ENCOUNTER — Ambulatory Visit (INDEPENDENT_AMBULATORY_CARE_PROVIDER_SITE_OTHER): Payer: Medicare Other | Admitting: Family Medicine

## 2018-11-22 ENCOUNTER — Encounter: Payer: Self-pay | Admitting: Family Medicine

## 2018-11-22 DIAGNOSIS — F32A Depression, unspecified: Secondary | ICD-10-CM

## 2018-11-22 DIAGNOSIS — F419 Anxiety disorder, unspecified: Secondary | ICD-10-CM | POA: Diagnosis not present

## 2018-11-22 DIAGNOSIS — D72829 Elevated white blood cell count, unspecified: Secondary | ICD-10-CM

## 2018-11-22 DIAGNOSIS — Z86718 Personal history of other venous thrombosis and embolism: Secondary | ICD-10-CM | POA: Diagnosis not present

## 2018-11-22 DIAGNOSIS — R7303 Prediabetes: Secondary | ICD-10-CM | POA: Insufficient documentation

## 2018-11-22 DIAGNOSIS — F329 Major depressive disorder, single episode, unspecified: Secondary | ICD-10-CM

## 2018-11-22 DIAGNOSIS — I10 Essential (primary) hypertension: Secondary | ICD-10-CM

## 2018-11-22 NOTE — Progress Notes (Signed)
Virtual Visit via telephone Note  This visit type was conducted due to national recommendations for restrictions regarding the COVID-19 pandemic (e.g. social distancing).  This format is felt to be most appropriate for this patient at this time.  All issues noted in this document were discussed and addressed.  No physical exam was performed (except for noted visual exam findings with Video Visits).   I connected with Kathleen Barton today at  4:00 PM EDT by telephone and verified that I am speaking with the correct person using two identifiers. Location patient: home Location provider: work  Persons participating in the virtual visit: patient, provider  I discussed the limitations, risks, security and privacy concerns of performing an evaluation and management service by telephone and the availability of in person appointments. I also discussed with the patient that there may be a patient responsible charge related to this service. The patient expressed understanding and agreed to proceed.  Interactive audio and video telecommunications were attempted between this provider and patient, however failed, due to patient having technical difficulties OR patient did not have access to video capability.  We continued and completed visit with audio only.   Reason for visit: follow-up  HPI: HYPERTENSION  Disease Monitoring  Home BP Monitoring not checking Chest pain- no    Dyspnea- no Medications  Compliance-  Taking tenoretic.   Edema- no  History of DVT: no edema. Taking eliquis. No bleeding.   Depression/anxiety: Patient denies depression and anxiety.  She continues on Paxil.  Prediabetes: Patient has only been drinking water.  She cut out diet Dr. Malachi Bonds.  She walks some for exercise.  Eating mostly fruits and vegetables as well as lean meats.  She is cut down on her sugar intake.  Leukocytosis/thrombocytopenia: Patient was to have repeat lab work.  I discussed the reasoning behind this.    ROS: See pertinent positives and negatives per HPI.  Past Medical History:  Diagnosis Date  . Hypertension   . Urinary incontinence     Past Surgical History:  Procedure Laterality Date  . ABDOMINAL HYSTERECTOMY  1974    Family History  Problem Relation Age of Onset  . Heart disease Unknown   . Hypertension Unknown     SOCIAL HX: Non-smoker.   Current Outpatient Medications:  .  apixaban (ELIQUIS) 5 MG TABS tablet, Take 1 tablet (5 mg total) by mouth 2 (two) times daily., Disp: 180 tablet, Rfl: 0 .  atenolol-chlorthalidone (TENORETIC) 50-25 MG tablet, Take 1 tablet by mouth daily. for blood pressure, Disp: 90 tablet, Rfl: 0 .  cholecalciferol (VITAMIN D) 1000 units tablet, Take 1,000 Units by mouth daily., Disp: , Rfl:  .  cyanocobalamin 1000 MCG tablet, Take 1,000 mcg by mouth daily., Disp: , Rfl:  .  meclizine (ANTIVERT) 25 MG tablet, Take 25 mg by mouth 3 (three) times daily as needed for dizziness., Disp: , Rfl:  .  PARoxetine (PAXIL) 30 MG tablet, Take 1 tablet (30 mg total) by mouth daily., Disp: 90 tablet, Rfl: 0  EXAM: This was a telehealth telephone visit notes no physical exam was completed.  ASSESSMENT AND PLAN:  Discussed the following assessment and plan:  Essential hypertension She will have her daughter-in-law check her blood pressure and contact us next week with the readings.  She will continue Tenoretic.  She will come in for lab work.  Anxiety and depression Asymptomatic.  Continue Paxil.  History of DVT of lower extremity Asymptomatic.  Continue Eliquis.  Patient has limited mobility  and is at higher risk for recurrence of DVT given this.  Leukocytosis Discussed the need for repeat labs.  These have been ordered.  She will be scheduled for labs.  Prediabetes Continue with dietary changes.  Discussed remaining active.  Recheck A1c.   Social distancing precautions and sick precautions given regarding COVID-19.   I discussed the assessment and  treatment plan with the patient. The patient was provided an opportunity to ask questions and all were answered. The patient agreed with the plan and demonstrated an understanding of the instructions.   The patient was advised to call back or seek an in-person evaluation if the symptoms worsen or if the condition fails to improve as anticipated.  I provided 13 minutes of non-face-to-face time during this encounter.   Marikay AlarEric Aolanis Crispen, MD

## 2018-11-22 NOTE — Assessment & Plan Note (Signed)
Asymptomatic.  Continue Paxil. 

## 2018-11-22 NOTE — Assessment & Plan Note (Signed)
Discussed the need for repeat labs.  These have been ordered.  She will be scheduled for labs.

## 2018-11-22 NOTE — Assessment & Plan Note (Signed)
Continue with dietary changes.  Discussed remaining active.  Recheck A1c.

## 2018-11-22 NOTE — Assessment & Plan Note (Signed)
She will have her daughter-in-law check her blood pressure and contact us next week with the readings.  She will continue Tenoretic.  She will come in for lab work.

## 2018-11-22 NOTE — Assessment & Plan Note (Signed)
Asymptomatic.  Continue Eliquis.  Patient has limited mobility and is at higher risk for recurrence of DVT given this.

## 2018-12-24 ENCOUNTER — Other Ambulatory Visit: Payer: Medicare Other

## 2019-01-06 ENCOUNTER — Other Ambulatory Visit: Payer: Medicare Other

## 2019-01-16 ENCOUNTER — Other Ambulatory Visit: Payer: Medicare Other

## 2019-01-20 ENCOUNTER — Other Ambulatory Visit: Payer: Medicare Other

## 2019-01-27 DIAGNOSIS — R269 Unspecified abnormalities of gait and mobility: Secondary | ICD-10-CM | POA: Diagnosis not present

## 2019-01-27 DIAGNOSIS — I10 Essential (primary) hypertension: Secondary | ICD-10-CM | POA: Diagnosis not present

## 2019-01-27 DIAGNOSIS — Z79899 Other long term (current) drug therapy: Secondary | ICD-10-CM | POA: Diagnosis not present

## 2019-02-03 ENCOUNTER — Other Ambulatory Visit: Payer: Medicare Other

## 2019-02-13 DIAGNOSIS — M79605 Pain in left leg: Secondary | ICD-10-CM | POA: Diagnosis not present

## 2019-02-13 DIAGNOSIS — Z136 Encounter for screening for cardiovascular disorders: Secondary | ICD-10-CM | POA: Diagnosis not present

## 2019-02-13 DIAGNOSIS — R609 Edema, unspecified: Secondary | ICD-10-CM | POA: Diagnosis not present

## 2019-02-26 ENCOUNTER — Telehealth: Payer: Self-pay

## 2019-02-26 MED ORDER — PAROXETINE HCL 30 MG PO TABS: 30.0000 mg | ORAL_TABLET | Freq: Every day | ORAL | 1 refills | Status: DC

## 2019-02-26 MED ORDER — APIXABAN 5 MG PO TABS: 5.0000 mg | ORAL_TABLET | Freq: Two times a day (BID) | ORAL | 1 refills | Status: DC

## 2019-02-26 NOTE — Telephone Encounter (Signed)
Refilled

## 2019-03-31 ENCOUNTER — Encounter: Payer: Self-pay | Admitting: Family Medicine

## 2019-03-31 ENCOUNTER — Ambulatory Visit (INDEPENDENT_AMBULATORY_CARE_PROVIDER_SITE_OTHER): Payer: Medicare Other | Admitting: Family Medicine

## 2019-03-31 ENCOUNTER — Other Ambulatory Visit: Payer: Self-pay

## 2019-03-31 DIAGNOSIS — R928 Other abnormal and inconclusive findings on diagnostic imaging of breast: Secondary | ICD-10-CM

## 2019-03-31 DIAGNOSIS — Z86718 Personal history of other venous thrombosis and embolism: Secondary | ICD-10-CM

## 2019-03-31 DIAGNOSIS — I10 Essential (primary) hypertension: Secondary | ICD-10-CM | POA: Diagnosis not present

## 2019-03-31 NOTE — Assessment & Plan Note (Signed)
Undetermined control.  We will have a nurse check this during a BP check.  She will come in for labs as well.  Continue current regimen.

## 2019-03-31 NOTE — Assessment & Plan Note (Signed)
I encouraged continued dietary changes.  Discussed that she should get up every 1-2 hours to walk around for at least 5 minutes to get some activity.

## 2019-03-31 NOTE — Assessment & Plan Note (Signed)
No signs of recurrent DVT.  She will continue on Eliquis.  I had a risk-benefit discussion regarding the Eliquis with her.  I feel that the benefit of the medication outweighs her current risk given her lack of mobility led to her prior DVT.  This risk factor has not changed and likely will not change moving forward.

## 2019-03-31 NOTE — Progress Notes (Signed)
Virtual Visit via telephone Note  This visit type was conducted due to national recommendations for restrictions regarding the COVID-19 pandemic (e.g. social distancing).  This format is felt to be most appropriate for this patient at this time.  All issues noted in this document were discussed and addressed.  No physical exam was performed (except for noted visual exam findings with Video Visits).   I connected with Kathleen Barton today at 10:30 AM EST by telephone and verified that I am speaking with the correct person using two identifiers. Location patient: home Location provider: home office Persons participating in the virtual visit: patient, provider  I discussed the limitations, risks, security and privacy concerns of performing an evaluation and management service by telephone and the availability of in person appointments. I also discussed with the patient that there may be a patient responsible charge related to this service. The patient expressed understanding and agreed to proceed.  Interactive audio and video telecommunications were attempted between this provider and patient, however failed, due to patient having technical difficulties OR patient did not have access to video capability.  We continued and completed visit with audio only.   Reason for visit: follow-up  HPI: Hypertension: Her daughter-in-law is checking blood pressures at home though the patient does not know the numbers.  Taking atenolol and chlorthalidone.  No chest pain, shortness of breath, or edema.  History of DVT: She is taking Eliquis.  She said no bleeding issues.  She has had no recurrent issues with DVT or leg swelling.  Obesity: Diet is much healthier than it had been in the past.  Lots of vegetables and chicken.  No soda or sweet tea.  No sweets.  No junk food.  She does some exercise with moving her legs and does walk some though she is sitting a lot.  History of abnormal mammogram.  She notes she was  followed by a surgeon for many years and noted no surgery was needed.  No recent mammogram.  No lumps or tenderness noted by patient.   ROS: See pertinent positives and negatives per HPI.  Past Medical History:  Diagnosis Date  . Hypertension   . Urinary incontinence     Past Surgical History:  Procedure Laterality Date  . ABDOMINAL HYSTERECTOMY  1974    Family History  Problem Relation Age of Onset  . Heart disease Unknown   . Hypertension Unknown     SOCIAL HX: Non-smoker.   Current Outpatient Medications:  .  apixaban (ELIQUIS) 5 MG TABS tablet, Take 1 tablet (5 mg total) by mouth 2 (two) times daily., Disp: 180 tablet, Rfl: 1 .  atenolol-chlorthalidone (TENORETIC) 50-25 MG tablet, Take 1 tablet by mouth daily. for blood pressure, Disp: 90 tablet, Rfl: 0 .  cholecalciferol (VITAMIN D) 1000 units tablet, Take 1,000 Units by mouth daily., Disp: , Rfl:  .  cyanocobalamin 1000 MCG tablet, Take 1,000 mcg by mouth daily., Disp: , Rfl:  .  meclizine (ANTIVERT) 25 MG tablet, Take 25 mg by mouth 3 (three) times daily as needed for dizziness., Disp: , Rfl:  .  PARoxetine (PAXIL) 30 MG tablet, Take 1 tablet (30 mg total) by mouth daily., Disp: 90 tablet, Rfl: 1  EXAM: This is a telehealth telephone visit and thus no physical exam was completed.  ASSESSMENT AND PLAN:  Discussed the following assessment and plan:  Essential hypertension Undetermined control.  We will have a nurse check this during a BP check.  She will come in  for labs as well.  Continue current regimen.  Abnormal mammogram Discussed completing a mammogram in the future.  She wants to defer this until at least after Christmas.  We will revisit this subject at her next visit.  History of DVT of lower extremity No signs of recurrent DVT.  She will continue on Eliquis.  I had a risk-benefit discussion regarding the Eliquis with her.  I feel that the benefit of the medication outweighs her current risk given her lack  of mobility led to her prior DVT.  This risk factor has not changed and likely will not change moving forward.  Morbid obesity (HCC) I encouraged continued dietary changes.  Discussed that she should get up every 1-2 hours to walk around for at least 5 minutes to get some activity.    I discussed the assessment and treatment plan with the patient. The patient was provided an opportunity to ask questions and all were answered. The patient agreed with the plan and demonstrated an understanding of the instructions.   The patient was advised to call back or seek an in-person evaluation if the symptoms worsen or if the condition fails to improve as anticipated.  I provided 12 minutes of non-face-to-face time during this encounter.   Marikay Alar, MD

## 2019-03-31 NOTE — Assessment & Plan Note (Signed)
Discussed completing a mammogram in the future.  She wants to defer this until at least after Christmas.  We will revisit this subject at her next visit.

## 2019-04-22 ENCOUNTER — Other Ambulatory Visit: Payer: Medicare Other

## 2019-04-30 ENCOUNTER — Ambulatory Visit: Payer: Medicare Other

## 2019-04-30 ENCOUNTER — Other Ambulatory Visit: Payer: Medicare Other

## 2019-05-15 ENCOUNTER — Other Ambulatory Visit: Payer: Self-pay

## 2019-05-15 ENCOUNTER — Ambulatory Visit (INDEPENDENT_AMBULATORY_CARE_PROVIDER_SITE_OTHER): Payer: Medicare Other

## 2019-05-15 ENCOUNTER — Encounter (INDEPENDENT_AMBULATORY_CARE_PROVIDER_SITE_OTHER): Payer: Self-pay

## 2019-05-15 VITALS — Ht 68.0 in | Wt 347.0 lb

## 2019-05-15 DIAGNOSIS — Z0000 Encounter for general adult medical examination without abnormal findings: Secondary | ICD-10-CM

## 2019-05-15 NOTE — Patient Instructions (Addendum)
  Ms. Wolz , Thank you for taking time to come for your Medicare Wellness Visit. I appreciate your ongoing commitment to your health goals. Please review the following plan we discussed and let me know if I can assist you in the future.   These are the goals we discussed: Goals    . Increase physical activity     Walk for exercise as tolerated       This is a list of the screening recommended for you and due dates:  Health Maintenance  Topic Date Due  . Tetanus Vaccine  08/01/1958  . DEXA scan (bone density measurement)  07/31/2004  . Pneumonia vaccines (2 of 2 - PPSV23) 09/08/2017  . Flu Shot  07/23/2019*  *Topic was postponed. The date shown is not the original due date.

## 2019-05-15 NOTE — Progress Notes (Signed)
Subjective:   Kathleen Barton is a 79 y.o. female who presents for Medicare Annual (Subsequent) preventive examination.  Review of Systems:  No ROS.  Medicare Wellness Virtual Visit.  Visual/audio telehealth visit, UTA vital signs.  Ht/Wt provided.  See social history for additional risk factors.  Cardiac Risk Factors include: advanced age (>34men, >64 women);hypertension     Objective:     Vitals: Ht 5\' 8"  (1.727 m)   Wt (!) 347 lb (157.4 kg)   BMI 52.76 kg/m   Body mass index is 52.76 kg/m.  Advanced Directives 05/15/2019 09/08/2016 08/27/2015  Does Patient Have a Medical Advance Directive? Yes No No  Type of Paramedic of [place];Living will - -  Does patient want to make changes to medical advance directive? No - Patient declined - -  Copy of [place] in Chart? No - copy requested - -  Would patient like information on creating a medical advance directive? - Yes (MAU/Ambulatory/Procedural Areas - Information given) No - patient declined information    Tobacco Social History   Tobacco Use  Smoking Status Never Smoker  Smokeless Tobacco Never Used     Counseling given: Not Answered   Clinical Intake:  Pre-visit preparation completed: Yes        Diabetes: No  How often do you need to have someone help you when you read instructions, pamphlets, or other written materials from your doctor or pharmacy?: 1 - Never  Interpreter Needed?: No     Past Medical History:  Diagnosis Date  . Hypertension   . Urinary incontinence    Past Surgical History:  Procedure Laterality Date  . ABDOMINAL HYSTERECTOMY  1974   Family History  Problem Relation Age of Onset  . Heart disease Other   . Hypertension Other    Social History   Socioeconomic History  . Marital status: Single    Spouse name: Not on file  . Number of children: Not on file  . Years of education: Not on file  . Highest education level: Not on file    Occupational History  . Not on file  Tobacco Use  . Smoking status: Never Smoker  . Smokeless tobacco: Never Used  Substance and Sexual Activity  . Alcohol use: No    Alcohol/week: 0.0 standard drinks  . Drug use: No  . Sexual activity: Never  Other Topics Concern  . Not on file  Social History Narrative  . Not on file   Social Determinants of Health   Financial Resource Strain: Low Risk   . Difficulty of Paying Living Expenses: Not hard at all  Food Insecurity: No Food Insecurity  . Worried About Charity fundraiser in the Last Year: Never true  . Ran Out of Food in the Last Year: Never true  Transportation Needs: No Transportation Needs  . Lack of Transportation (Medical): No  . Lack of Transportation (Non-Medical): No  Physical Activity:   . Days of Exercise per Week: Not on file  . Minutes of Exercise per Session: Not on file  Stress: No Stress Concern Present  . Feeling of Stress : Not at all  Social Connections: Unknown  . Frequency of Communication with Friends and Family: More than three times a week  . Frequency of Social Gatherings with Friends and Family: More than three times a week  . Attends Religious Services: Never  . Active Member of Clubs or Organizations: Yes  . Attends Archivist  Meetings: Never  . Marital Status: Not on file    Outpatient Encounter Medications as of 05/15/2019  Medication Sig  . apixaban (ELIQUIS) 5 MG TABS tablet Take 1 tablet (5 mg total) by mouth 2 (two) times daily.  Marland Kitchen atenolol-chlorthalidone (TENORETIC) 50-25 MG tablet Take 1 tablet by mouth daily. for blood pressure  . cholecalciferol (VITAMIN D) 1000 units tablet Take 1,000 Units by mouth daily.  . cyanocobalamin 1000 MCG tablet Take 1,000 mcg by mouth daily.  . meclizine (ANTIVERT) 25 MG tablet Take 25 mg by mouth 3 (three) times daily as needed for dizziness.  Marland Kitchen PARoxetine (PAXIL) 30 MG tablet Take 1 tablet (30 mg total) by mouth daily.   No  facility-administered encounter medications on file as of 05/15/2019.    Activities of Daily Living In your present state of health, do you have any difficulty performing the following activities: 05/15/2019  Hearing? N  Vision? N  Difficulty concentrating or making decisions? N  Walking or climbing stairs? Y  Comment Unsteady gait; walker in use.  Dressing or bathing? N  Doing errands, shopping? N  Preparing Food and eating ? Y  Comment She does not cook. Daughter in law cooks; self feeds.  Using the Toilet? N  In the past six months, have you accidently leaked urine? N  Do you have problems with loss of bowel control? N  Managing your Medications? N  Managing your Finances? N  Comment Son assists as needed  Housekeeping or managing your Housekeeping? Y  Comment Daughter in law assist  Some recent data might be hidden    Patient Care Team: Glori Luis, MD as PCP - General (Family Medicine)    Assessment:   This is a routine wellness examination for Kathleen Barton.  Nurse connected with patient 05/15/19 at 11:30 AM EST by a telephone enabled telemedicine application and verified that I am speaking with the correct person using two identifiers. Patient stated full name and DOB. Patient gave permission to continue with virtual visit. Patient's location was at home and Nurse's location was at [place] office.   Patient is alert and oriented x3. Patient denies difficulty focusing or concentrating. Patient likes to watch game shows and challenge herself for brain stimulation.   Health Maintenance Due: -PNA and Tdap vaccine- discussed; to be completed with doctor in visit or local pharmacy.  -Dexa Scan- postponed per patient request See completed HM at the end of note.   Eye: Visual acuity not assessed. Virtual visit. Followed by their ophthalmologist.  Dental: UTD    Hearing: Demonstrates normal hearing during visit.  Safety:  Patient feels safe at home- yes. Patient lives  with son.  Patient does have smoke detectors at home- yes Patient does wear sunscreen or protective clothing when in direct sunlight - yes Patient does wear seat belt when in a moving vehicle - yes Patient drives- no Adequate lighting in walkways free from debris- yes Grab bars and handrails used as appropriate- yes Ambulates with an assistive device- yes; walker Cell phone on person when ambulating outside of the home- yes  Social: Alcohol intake - no    Smoking history- never   Smokers in home? none Illicit drug use? none  Medication: Taking as directed and without issues.  Pill box in use -yes  Self managed - yes   Covid-19: Precautions and sickness symptoms discussed. Wears mask, social distancing, hand hygiene as appropriate.   Activities of Daily Living Patient denies needing assistance with: feeding themselves, getting  from bed to chair, getting to the toilet, bathing/showering, dressing, or preparing meals.  Assisted by daughter in law with household chores, and preparing meals.   Discussed the importance of a healthy diet, water intake and the benefits of aerobic exercise.   Physical activity- active in the home; encouraged to walk and do chair exercises.  Diet:  Regular Water: good intake Caffeine: none  Other Providers Patient Care Team: Glori Luis, MD as PCP - General (Family Medicine)  Exercise Activities and Dietary recommendations Current Exercise Habits: Home exercise routine, Type of exercise: walking, Intensity: Mild  Goals    . Increase physical activity     Walk for exercise as tolerated       Fall Risk Fall Risk  05/15/2019 03/31/2019 11/22/2018 09/08/2016  Falls in the past year? 0 0 0 No  Number falls in past yr: - 0 0 -  Follow up Falls evaluation completed Falls evaluation completed - -   Timed Get Up and Go performed: no, virtual visit  Depression Screen PHQ 2/9 Scores 05/15/2019 03/31/2019 11/22/2018 09/08/2016  PHQ - 2 Score 0 0  0 0  PHQ- 9 Score - - - 0     Cognitive Function MMSE - Mini Mental State Exam 09/08/2016  Orientation to time 5  Orientation to Place 5  Registration 3  Attention/ Calculation 5  Recall 3  Language- name 2 objects 2  Language- repeat 1  Language- follow 3 step command 3  Language- read & follow direction 1  Write a sentence 1  Copy design 1  Total score 30     6CIT Screen 05/15/2019  What Year? 0 points  What month? 0 points  What time? 0 points  Count back from 20 0 points  Months in reverse 0 points    Immunization History  Administered Date(s) Administered  . Influenza-Unspecified 02/22/2018  . Pneumococcal Conjugate-13 09/08/2016   Screening Tests Health Maintenance  Topic Date Due  . TETANUS/TDAP  08/01/1958  . DEXA SCAN  07/31/2004  . PNA vac Low Risk Adult (2 of 2 - PPSV23) 09/08/2017  . INFLUENZA VACCINE  07/23/2019 (Originally 11/23/2018)      Plan:   Keep all routine maintenance appointments.   Follow up 09/30/19  Medicare Attestation I have personally reviewed: The patient's medical and social history Their use of alcohol, tobacco or illicit drugs Their current medications and supplements The patient's functional ability including ADLs,fall risks, home safety risks, cognitive, and hearing and visual impairment Diet and physical activities Evidence for depression    I have reviewed and discussed with patient certain preventive protocols, quality metrics, and best practice recommendations.     Ashok Pall, LPN  [id number]/[id number]

## 2019-05-17 DIAGNOSIS — I10 Essential (primary) hypertension: Secondary | ICD-10-CM | POA: Diagnosis not present

## 2019-05-17 DIAGNOSIS — Z79899 Other long term (current) drug therapy: Secondary | ICD-10-CM | POA: Diagnosis not present

## 2019-05-26 DIAGNOSIS — I10 Essential (primary) hypertension: Secondary | ICD-10-CM | POA: Diagnosis not present

## 2019-05-26 DIAGNOSIS — M1990 Unspecified osteoarthritis, unspecified site: Secondary | ICD-10-CM | POA: Diagnosis not present

## 2019-05-26 DIAGNOSIS — I82A21 Chronic embolism and thrombosis of right axillary vein: Secondary | ICD-10-CM | POA: Diagnosis not present

## 2019-05-26 DIAGNOSIS — R269 Unspecified abnormalities of gait and mobility: Secondary | ICD-10-CM | POA: Diagnosis not present

## 2019-09-09 ENCOUNTER — Other Ambulatory Visit: Payer: Self-pay

## 2019-09-09 ENCOUNTER — Telehealth: Payer: Self-pay | Admitting: Family Medicine

## 2019-09-09 DIAGNOSIS — I10 Essential (primary) hypertension: Secondary | ICD-10-CM

## 2019-09-09 MED ORDER — ATENOLOL-CHLORTHALIDONE 50-25 MG PO TABS: 1.0000 | ORAL_TABLET | Freq: Every day | ORAL | 0 refills | Status: DC

## 2019-09-09 NOTE — Telephone Encounter (Signed)
Medication refill was sent to the pharmacy electronically.  Luria Rosario,cma

## 2019-09-09 NOTE — Telephone Encounter (Signed)
[facility] pharmacy called for refill for atenolol-chlorthalidone (TENORETIC) 50-25 MG tablet

## 2019-09-09 NOTE — Telephone Encounter (Signed)
Pharmacy called need refill for [facility] for atenolol-chlorthalidone (TENORETIC) 50-25 MG tablet [facility] pharmacy/[place]

## 2019-09-09 NOTE — Telephone Encounter (Signed)
Pharmacy called need refill on medication for                            Demographics               BREAN CARBERRY  79 year old female  1940/05/11  Comm Pref: Mail    [address]  [place] [address]      [phone] (W)         Specialty Comments  Show all  Report     No comments regarding your specialty           Problem List     11 items  Cardiovascular and Mediastinum     Essential hypertension         Nervous and Auditory     Weakness of both lower extremities         Other     History of DVT of lower extremity       B12 deficiency       Morbid obesity (HCC)       Urge incontinence of urine       Abnormal mammogram       Leukocytosis       Anxiety and depression       History of vertigo       Prediabetes               Recent OV/Telephone     Maximum visits displayed: 10   Date  Type  Provider  Description  03/31/2019 Office Visit Marikay Alar, MD Essential hypertension; Abnormal mammogram; History of DVT...  02/26/2019 Telephone Allean Found, CMA   11/22/2018 Office Visit Marikay Alar, MD Essential hypertension; Anxiety and depression; History of...  07/26/2018 Telephone Marikay Alar, MD   07/26/2018 Office Visit Marikay Alar, MD History of DVT of lower extremity (Primary Dx); Essential h...  06/04/2018 Telephone Davis Gourd, CMA   10/11/2017 Telephone Algernon Huxley, CMA   08/17/2017 Telephone Sarita Bottom, RT   08/17/2017 Telephone Sarita Bottom, RT   08/06/2017 Telephone Thurmond Butts, CMA            Care Team and Communications        PCPs  Type  Glori Luis, MD General      No other patient care team members       Recipients of Past 1 Communications Show More       Hospital Encounter - 08/27/2015        Glori Luis, MD 08/27/2015 In Basket            Medications            apixaban (ELIQUIS) 5 MG TABS tablet       atenolol-chlorthalidone (TENORETIC) 50-25 MG tablet       cholecalciferol (VITAMIN D) 1000 units tablet       cyanocobalamin 1000 MCG tablet       meclizine (ANTIVERT) 25 MG tablet       PARoxetine (PAXIL) 30 MG tablet             Preferred Pharmacies      [facility] - [place], [place] - [address]  Phone:  [phone]  Fax:  [phone]            Significant History/Details         Smoking  Never Smoker    Smokeless Tobacco   Never Used    Alcohol   0.0 standard drinks of alcohol/week    Preferred Language   English            Since Last [facility] Visit (106mo Ago)            Jan 21             Clinical Support with [facility] - O'Brien-Blaney, D  Encounter for Medicare annual wellness exam (Primary Dx)          Immunizations/Injections         Influenza-Unspecified11/04/2017      Pneumococcal Conjugate-135/18/2018          Vitals from encounters over the past 365 days       03/31/19  11/22/18  Vitals  Height 5\' 8"  (1.727 m) 5\' 8"  (1.727 m)  Weight 347 lb (157.4 kg) 347 lb (157.4 kg)         Recent Imaging Studies (Last 10 results in 10 years)      08/27/15 1707 Venous Img Lower Unilateral Left   08/27/15 1638 DG Lumbar Spine Complete                Recent ED/Admissions     Maximum visits displayed: 10   Date  Type  Provider  Description  08/27/2015 Emergency Department  DVT (deep venous thrombosis), left (Primary Dx)  08/27/2015 Hospital Visit  Weakness of both lower extremities  08/27/2015 Hospital Visit    08/27/2015 Hospital Visit  Swelling of left lower extremity            Allergies      No Known Allergies         Lipids (Last 6 results in  3 years)      None        Advanced Directives as of 05/15/19   Does Patient Have a Medical Advance Directive? Yes  Does patient want to make changes to medical advance directive? No - Patient declined  Type of Advance Directive Healthcare Power of Attorney, Living will  Copy of Healthcare Power of Attorney in Chart? No - copy requested  Copy of Living Will in Chart? No - copy requested              Patient's Last Relevant Note From My Specialty   Attested  Encounter Date: 05/15/2019  O'Brien-Blaney, 05/17/19, LPN       Expand AllCollapse All           untitled image      Subjective:       Subjective                             Objective:       Objective  Assessment:       Assessment   This is a routine wellness examination for [place].     Nurse connected with patient 05/15/19 at 11:30 AM EST by a telephone enabled telemedicine application and verified that I am speaking with the correct person using two identifiers. Patient stated full name and DOB. Patient gave permission to continue with virtual visit. Patient's location was at home and Nurse's location was at [place] office.      Patient is alert and oriented x3.  Patient denies difficulty focusing or concentrating.  Patient likes to watch game shows and challenge herself for brain stimulation.      Health Maintenance Due:  -PNA and Tdap vaccine- discussed; to be completed with doctor in visit or local pharmacy.   -Dexa Scan- postponed per patient request  See completed HM at the end of note.      Eye:  Visual acuity not assessed. Virtual visit. Followed by their ophthalmologist.     Dental:  UTD       Hearing:  Demonstrates normal hearing during visit.     Safety:   Patient feels safe at home- yes.  Patient lives with son.   Patient does have smoke detectors at home- yes  Patient does wear sunscreen or protective clothing when in direct sunlight - yes  Patient does wear seat belt when in a moving vehicle - yes  Patient drives- no  Adequate lighting in walkways free from debris- yes  Grab bars and handrails used as appropriate- yes  Ambulates with an assistive device- yes; walker  Cell phone on person when ambulating outside of the home- yes     Social:  Alcohol intake - no     Smoking history- never    Smokers in home? none  Illicit drug use? none     Medication:  Taking as directed  and without issues.   Pill box in use -yes   Self managed - yes      Covid-19:  Precautions and sickness symptoms discussed. Wears mask, social distancing, hand hygiene as appropriate.      Activities of Daily Living  Patient denies needing assistance with: feeding themselves, getting from bed to chair, getting to the toilet, bathing/showering, dressing, or preparing meals.   Assisted by daughter in law with household chores, and preparing meals.      Discussed the importance of a healthy diet, water intake and the benefits of aerobic exercise.    Physical activity- active in the home; encouraged to walk and do chair exercises.     Diet:   Regular  Water: good intake  Caffeine: none     Other Providers  Patient Care Team:  Leone Haven, MD as PCP - General (Family Medicine)     Exercise Activities and Dietary recommendations  Current Exercise Habits: Home exercise routine, Type of exercise: walking, Intensity: Mild               Goals                       Increase physical activity                Walk for exercise as tolerated                   Fall Risk   Fall Risk    05/15/2019   03/31/2019   11/22/2018   09/08/2016    Falls in the past year?  0   0   0   No    Number falls in past yr:   -   0   0   -    Follow up   Falls evaluation completed   Falls evaluation completed   -   -       Timed Get Up and Go performed: no, virtual visit     Depression Screen   PHQ 2/9 Scores   05/15/2019   03/31/2019   11/22/2018   09/08/2016    PHQ - 2 Score   0   0   0   0    PHQ- 9 Score   -   -   -   0          Cognitive Function   MMSE - Mini Mental State Exam   09/08/2016    Orientation to time   5    Orientation to Place   5    Registration   3    Attention/ Calculation   5    Recall   3    Language- name 2 objects    2    Language- repeat   1    Language- follow 3 step command   3    Language- read & follow direction   1    Write a sentence   1    Copy design   1    Total score   30          6CIT Screen   05/15/2019    What Year?   0 points    What month?   0 points    What time?   0 points    Count back from 20   0 points    Months in reverse   0 points               Immunization History    Administered   Date(s) Administered       Influenza-Unspecified   02/22/2018       Pneumococcal Conjugate-13   09/08/2016       Screening Tests       Health Maintenance    Topic   Date Due       TETANUS/TDAP    08/01/1958       DEXA SCAN    07/31/2004       PNA vac Low Risk Adult (2 of 2 - PPSV23)   09/08/2017       INFLUENZA VACCINE    07/23/2019 (Originally 11/23/2018)              Plan:       Plan   Keep all routine maintenance appointments.      Follow up 09/30/19     Medicare Attestation  I have personally reviewed:  The patient's medical and social history  Their use of alcohol, tobacco or illicit drugs  Their current medications and supplements  The patient's functional ability including ADLs,fall risks, home safety risks, cognitive, and hearing and visual impairment  Diet and physical activities  Evidence for depression       I have reviewed and discussed with patient certain preventive protocols, quality metrics, and best practice recommendations.            Ashok Pall, LPN                       05/15/2019

## 2019-09-30 ENCOUNTER — Ambulatory Visit: Payer: Medicare Other | Admitting: Family Medicine

## 2019-10-08 ENCOUNTER — Telehealth: Payer: Self-pay | Admitting: Family Medicine

## 2019-10-08 DIAGNOSIS — I10 Essential (primary) hypertension: Secondary | ICD-10-CM

## 2019-10-08 MED ORDER — PAROXETINE HCL 30 MG PO TABS: 30.0000 mg | ORAL_TABLET | Freq: Every day | ORAL | 1 refills | Status: DC

## 2019-10-08 MED ORDER — ATENOLOL-CHLORTHALIDONE 50-25 MG PO TABS: 1.0000 | ORAL_TABLET | Freq: Every day | ORAL | 0 refills | Status: DC

## 2019-10-08 MED ORDER — APIXABAN 5 MG PO TABS: 5.0000 mg | ORAL_TABLET | Freq: Two times a day (BID) | ORAL | 1 refills | Status: DC

## 2019-10-08 NOTE — Telephone Encounter (Signed)
[facility] pharmacy called about refill for pt medication PARoxetine (PAXIL) 30 MG tablet, apixaban (ELIQUIS) 5 MG TABS tablet, atenolol-chlorthalidone (TENORETIC) 50-25 MG tablet

## 2019-11-24 ENCOUNTER — Other Ambulatory Visit: Payer: Self-pay

## 2019-11-24 ENCOUNTER — Inpatient Hospital Stay
Admission: EM | Admit: 2019-11-24 | Discharge: 2019-12-02 | DRG: 070 | Disposition: A | Discharge status: Skilled Nursing Facility | Payer: Medicare Other | Attending: Internal Medicine | Admitting: Internal Medicine

## 2019-11-24 ENCOUNTER — Emergency Department: Payer: Medicare Other

## 2019-11-24 ENCOUNTER — Observation Stay: Admit: 2019-11-24 | Payer: Medicare Other

## 2019-11-24 DIAGNOSIS — R531 Weakness: Secondary | ICD-10-CM | POA: Diagnosis not present

## 2019-11-24 DIAGNOSIS — I700 Atherosclerosis of aorta: Secondary | ICD-10-CM | POA: Diagnosis not present

## 2019-11-24 DIAGNOSIS — J9621 Acute and chronic respiratory failure with hypoxia: Secondary | ICD-10-CM | POA: Diagnosis not present

## 2019-11-24 DIAGNOSIS — F419 Anxiety disorder, unspecified: Secondary | ICD-10-CM | POA: Diagnosis not present

## 2019-11-24 DIAGNOSIS — J9811 Atelectasis: Secondary | ICD-10-CM | POA: Diagnosis not present

## 2019-11-24 DIAGNOSIS — R6889 Other general symptoms and signs: Secondary | ICD-10-CM | POA: Diagnosis not present

## 2019-11-24 DIAGNOSIS — N182 Chronic kidney disease, stage 2 (mild): Secondary | ICD-10-CM | POA: Diagnosis not present

## 2019-11-24 DIAGNOSIS — Z20822 Contact with and (suspected) exposure to covid-19: Secondary | ICD-10-CM | POA: Diagnosis not present

## 2019-11-24 DIAGNOSIS — R392 Extrarenal uremia: Secondary | ICD-10-CM | POA: Diagnosis not present

## 2019-11-24 DIAGNOSIS — F329 Major depressive disorder, single episode, unspecified: Secondary | ICD-10-CM | POA: Diagnosis present

## 2019-11-24 DIAGNOSIS — N179 Acute kidney failure, unspecified: Secondary | ICD-10-CM | POA: Diagnosis not present

## 2019-11-24 DIAGNOSIS — E662 Morbid (severe) obesity with alveolar hypoventilation: Secondary | ICD-10-CM | POA: Diagnosis present

## 2019-11-24 DIAGNOSIS — N1831 Chronic kidney disease, stage 3a: Secondary | ICD-10-CM | POA: Diagnosis not present

## 2019-11-24 DIAGNOSIS — I5033 Acute on chronic diastolic (congestive) heart failure: Secondary | ICD-10-CM | POA: Diagnosis present

## 2019-11-24 DIAGNOSIS — J9601 Acute respiratory failure with hypoxia: Secondary | ICD-10-CM | POA: Diagnosis not present

## 2019-11-24 DIAGNOSIS — I2510 Atherosclerotic heart disease of native coronary artery without angina pectoris: Secondary | ICD-10-CM | POA: Diagnosis not present

## 2019-11-24 DIAGNOSIS — R319 Hematuria, unspecified: Secondary | ICD-10-CM | POA: Diagnosis not present

## 2019-11-24 DIAGNOSIS — J9602 Acute respiratory failure with hypercapnia: Secondary | ICD-10-CM | POA: Diagnosis not present

## 2019-11-24 DIAGNOSIS — Z6841 Body Mass Index (BMI) 40.0 and over, adult: Secondary | ICD-10-CM

## 2019-11-24 DIAGNOSIS — I5032 Chronic diastolic (congestive) heart failure: Secondary | ICD-10-CM

## 2019-11-24 DIAGNOSIS — Z79899 Other long term (current) drug therapy: Secondary | ICD-10-CM

## 2019-11-24 DIAGNOSIS — Z7901 Long term (current) use of anticoagulants: Secondary | ICD-10-CM | POA: Diagnosis not present

## 2019-11-24 DIAGNOSIS — Z8249 Family history of ischemic heart disease and other diseases of the circulatory system: Secondary | ICD-10-CM | POA: Diagnosis not present

## 2019-11-24 DIAGNOSIS — I130 Hypertensive heart and chronic kidney disease with heart failure and stage 1 through stage 4 chronic kidney disease, or unspecified chronic kidney disease: Secondary | ICD-10-CM | POA: Diagnosis present

## 2019-11-24 DIAGNOSIS — R32 Unspecified urinary incontinence: Secondary | ICD-10-CM | POA: Diagnosis present

## 2019-11-24 DIAGNOSIS — R0902 Hypoxemia: Secondary | ICD-10-CM | POA: Diagnosis not present

## 2019-11-24 DIAGNOSIS — F32A Depression, unspecified: Secondary | ICD-10-CM | POA: Diagnosis present

## 2019-11-24 DIAGNOSIS — Z87898 Personal history of other specified conditions: Secondary | ICD-10-CM

## 2019-11-24 DIAGNOSIS — J9622 Acute and chronic respiratory failure with hypercapnia: Secondary | ICD-10-CM | POA: Diagnosis present

## 2019-11-24 DIAGNOSIS — R410 Disorientation, unspecified: Secondary | ICD-10-CM | POA: Diagnosis not present

## 2019-11-24 DIAGNOSIS — Z66 Do not resuscitate: Secondary | ICD-10-CM | POA: Diagnosis not present

## 2019-11-24 DIAGNOSIS — Z9119 Patient's noncompliance with other medical treatment and regimen: Secondary | ICD-10-CM

## 2019-11-24 DIAGNOSIS — I10 Essential (primary) hypertension: Secondary | ICD-10-CM

## 2019-11-24 DIAGNOSIS — Z86718 Personal history of other venous thrombosis and embolism: Secondary | ICD-10-CM | POA: Diagnosis not present

## 2019-11-24 DIAGNOSIS — J9610 Chronic respiratory failure, unspecified whether with hypoxia or hypercapnia: Secondary | ICD-10-CM

## 2019-11-24 DIAGNOSIS — D72829 Elevated white blood cell count, unspecified: Secondary | ICD-10-CM | POA: Diagnosis present

## 2019-11-24 DIAGNOSIS — R4182 Altered mental status, unspecified: Secondary | ICD-10-CM | POA: Diagnosis not present

## 2019-11-24 DIAGNOSIS — R350 Frequency of micturition: Secondary | ICD-10-CM | POA: Diagnosis present

## 2019-11-24 DIAGNOSIS — Z90710 Acquired absence of both cervix and uterus: Secondary | ICD-10-CM

## 2019-11-24 DIAGNOSIS — I517 Cardiomegaly: Secondary | ICD-10-CM | POA: Diagnosis not present

## 2019-11-24 DIAGNOSIS — G9341 Metabolic encephalopathy: Secondary | ICD-10-CM | POA: Diagnosis not present

## 2019-11-24 DIAGNOSIS — N189 Chronic kidney disease, unspecified: Secondary | ICD-10-CM | POA: Diagnosis not present

## 2019-11-24 DIAGNOSIS — N19 Unspecified kidney failure: Secondary | ICD-10-CM

## 2019-11-24 DIAGNOSIS — Z743 Need for continuous supervision: Secondary | ICD-10-CM | POA: Diagnosis not present

## 2019-11-24 DIAGNOSIS — I5031 Acute diastolic (congestive) heart failure: Secondary | ICD-10-CM | POA: Diagnosis not present

## 2019-11-24 DIAGNOSIS — R404 Transient alteration of awareness: Secondary | ICD-10-CM | POA: Diagnosis not present

## 2019-11-24 DIAGNOSIS — R441 Visual hallucinations: Secondary | ICD-10-CM | POA: Diagnosis present

## 2019-11-24 LAB — COMPREHENSIVE METABOLIC PANEL
ALT: 24 U/L (ref 0–44)
AST: 34 U/L (ref 15–41)
Albumin: 3.8 g/dL (ref 3.5–5.0)
Alkaline Phosphatase: 54 U/L (ref 38–126)
Anion gap: 12 (ref 5–15)
BUN: 39 mg/dL — ABNORMAL HIGH (ref 8–23)
CO2: 29 mmol/L (ref 22–32)
Calcium: 9.1 mg/dL (ref 8.9–10.3)
Chloride: 97 mmol/L — ABNORMAL LOW (ref 98–111)
Creatinine, Ser: 1.32 mg/dL — ABNORMAL HIGH (ref 0.44–1.00)
GFR calc Af Amer: 44 mL/min — ABNORMAL LOW (ref >60)
GFR calc non Af Amer: 38 mL/min — ABNORMAL LOW (ref >60)
Glucose, Bld: 157 mg/dL — ABNORMAL HIGH (ref 70–99)
Potassium: 3.7 mmol/L (ref 3.5–5.1)
Sodium: 138 mmol/L (ref 135–145)
Total Bilirubin: 1.4 mg/dL — ABNORMAL HIGH (ref 0.3–1.2)
Total Protein: 8.1 g/dL (ref 6.5–8.1)

## 2019-11-24 LAB — CBC WITH DIFFERENTIAL/PLATELET
Abs Immature Granulocytes: 0.08 10*3/uL — ABNORMAL HIGH (ref 0.00–0.07)
Basophils Absolute: 0.1 10*3/uL (ref 0.0–0.1)
Basophils Relative: 0 %
Eosinophils Absolute: 0.1 10*3/uL (ref 0.0–0.5)
Eosinophils Relative: 1 %
HCT: 46.3 % — ABNORMAL HIGH (ref 36.0–46.0)
Hemoglobin: 14.6 g/dL (ref 12.0–15.0)
Immature Granulocytes: 1 %
Lymphocytes Relative: 9 %
Lymphs Abs: 1 10*3/uL (ref 0.7–4.0)
MCH: 30.4 pg (ref 26.0–34.0)
MCHC: 31.5 g/dL (ref 30.0–36.0)
MCV: 96.5 fL (ref 80.0–100.0)
Monocytes Absolute: 0.8 10*3/uL (ref 0.1–1.0)
Monocytes Relative: 7 %
Neutro Abs: 9.4 10*3/uL — ABNORMAL HIGH (ref 1.7–7.7)
Neutrophils Relative %: 82 %
Platelets: 246 10*3/uL (ref 150–400)
RBC: 4.8 MIL/uL (ref 3.87–5.11)
RDW: 14.6 % (ref 11.5–15.5)
WBC: 11.4 10*3/uL — ABNORMAL HIGH (ref 4.0–10.5)
nRBC: 0 % (ref 0.0–0.2)

## 2019-11-24 LAB — URINALYSIS, COMPLETE (UACMP) WITH MICROSCOPIC
Bacteria, UA: NONE SEEN
Bilirubin Urine: NEGATIVE
Glucose, UA: NEGATIVE mg/dL
Ketones, ur: 5 mg/dL — AB
Leukocytes,Ua: NEGATIVE
Nitrite: NEGATIVE
Protein, ur: NEGATIVE mg/dL
RBC / HPF: >50 RBC/hpf — ABNORMAL HIGH (ref 0–5)
Specific Gravity, Urine: 1.03 (ref 1.005–1.030)
pH: 5 (ref 5.0–8.0)

## 2019-11-24 LAB — PROTIME-INR
INR: 1.3 — ABNORMAL HIGH (ref 0.8–1.2)
Prothrombin Time: 15.9 seconds — ABNORMAL HIGH (ref 11.4–15.2)

## 2019-11-24 LAB — MAGNESIUM: Magnesium: 2.1 mg/dL (ref 1.7–2.4)

## 2019-11-24 LAB — BLOOD GAS, VENOUS
Acid-Base Excess: 5.2 mmol/L — ABNORMAL HIGH (ref 0.0–2.0)
Bicarbonate: 32.9 mmol/L — ABNORMAL HIGH (ref 20.0–28.0)
FIO2: 0.21
O2 Saturation: 83.3 %
Patient temperature: 37
pCO2, Ven: 61 mmHg — ABNORMAL HIGH (ref 44.0–60.0)
pH, Ven: 7.34 (ref 7.250–7.430)
pO2, Ven: 51 mmHg — ABNORMAL HIGH (ref 32.0–45.0)

## 2019-11-24 LAB — TROPONIN I (HIGH SENSITIVITY)
Troponin I (High Sensitivity): 16 ng/L (ref <18)
Troponin I (High Sensitivity): 17 ng/L (ref <18)

## 2019-11-24 LAB — BRAIN NATRIURETIC PEPTIDE: B Natriuretic Peptide: 93.8 pg/mL (ref 0.0–100.0)

## 2019-11-24 LAB — TSH: TSH: 0.998 u[IU]/mL (ref 0.350–4.500)

## 2019-11-24 LAB — AMMONIA: Ammonia: 19 umol/L (ref 9–35)

## 2019-11-24 LAB — SARS CORONAVIRUS 2 BY RT PCR (HOSPITAL ORDER, PERFORMED IN ~~LOC~~ HOSPITAL LAB): SARS Coronavirus 2: NEGATIVE

## 2019-11-24 IMAGING — CT CT HEAD W/O CM
3 of 5 series · 16 of 47 positions shown, 19 images · non-contrast
Comparison: None.

CLINICAL DATA: Mental status changes

EXAM:
CT HEAD WITHOUT CONTRAST
TECHNIQUE: Contiguous axial images were obtained from the base of the skull
through the vertex without intravenous contrast.

[Series 4: coronal soft tissue · coronal · 0.31mm/px · 3 of 67 slices shown]
[im 23/67  brain]
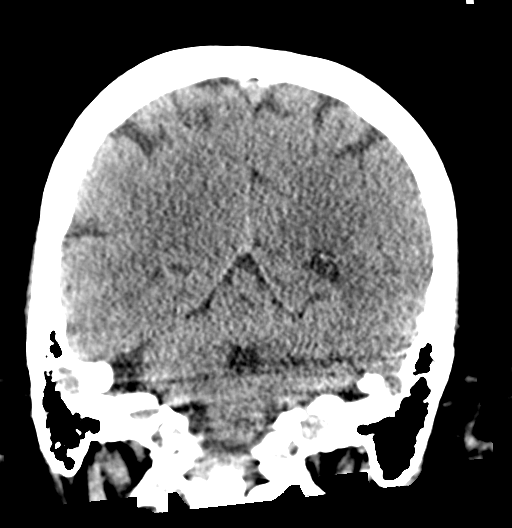
[im 30/67  brain]
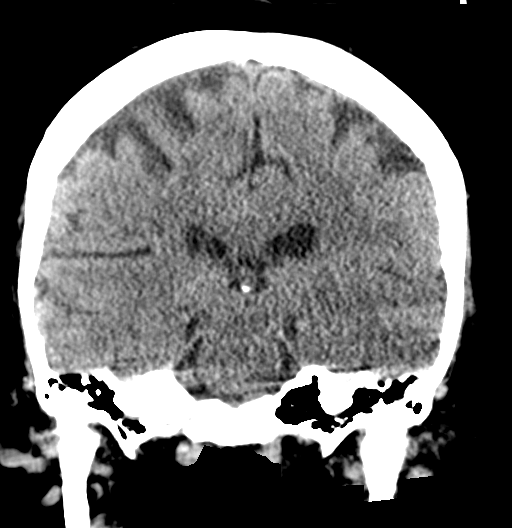
[im 37/67  brain]
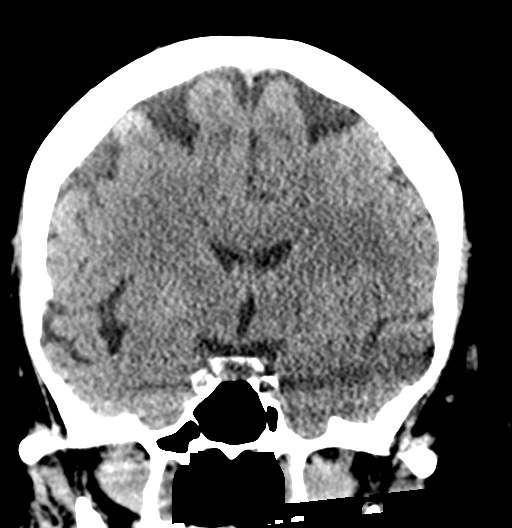

[Series 5: sagittal soft tissue · sagittal · 0.32mm/px · 3 of 54 slices shown]
[im 18/54  brain]
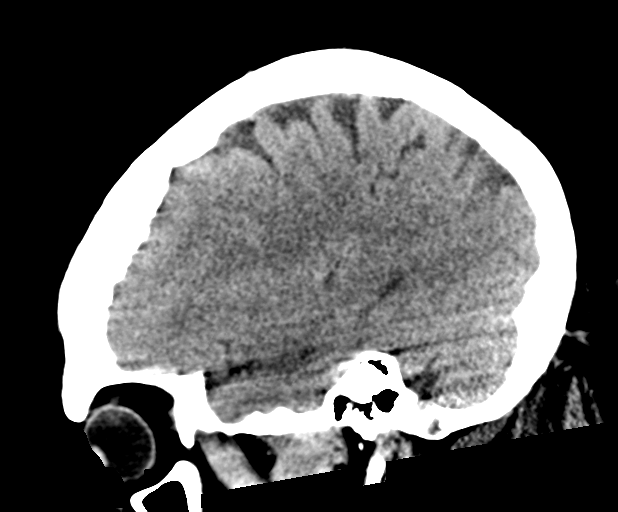
[im 27/54  brain]
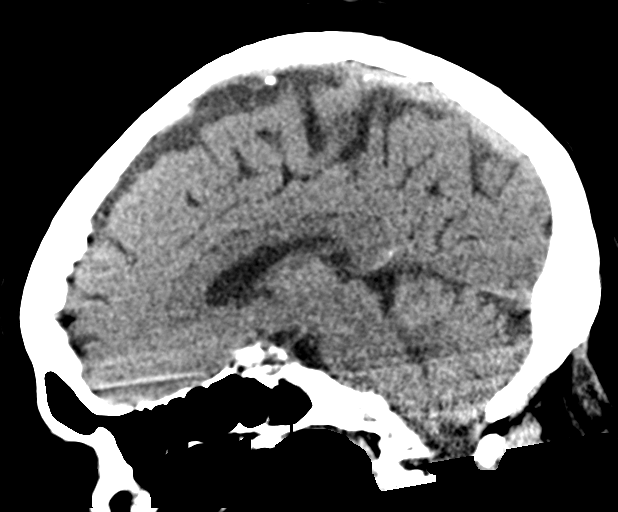
[im 36/54  brain]
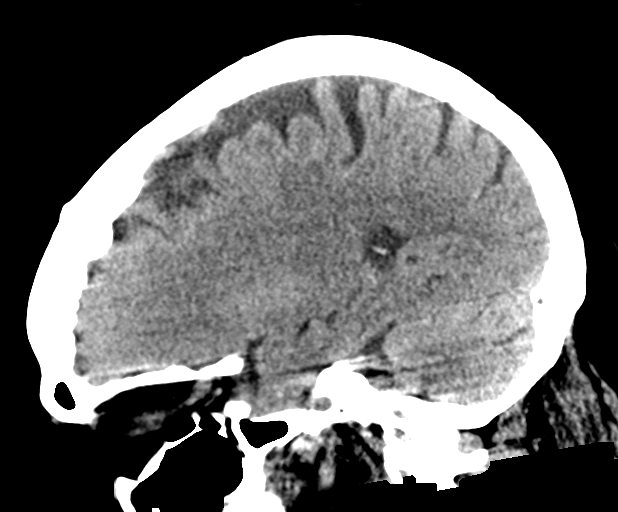

[Series 7: head (id) · axial · 0.47mm/px · z∈[-155,-15]mm · 10 of 82 slices shown, 13 images]
[im 6/82  brain]
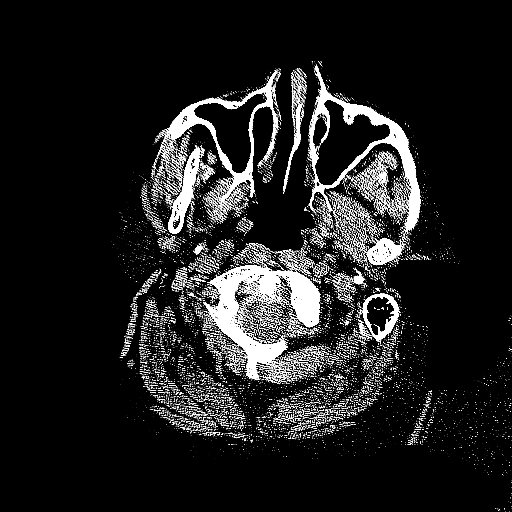
[im 6/82  bone]
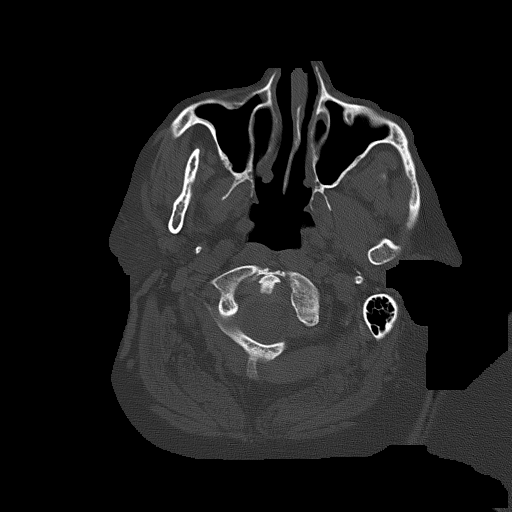
[im 16/82  brain]
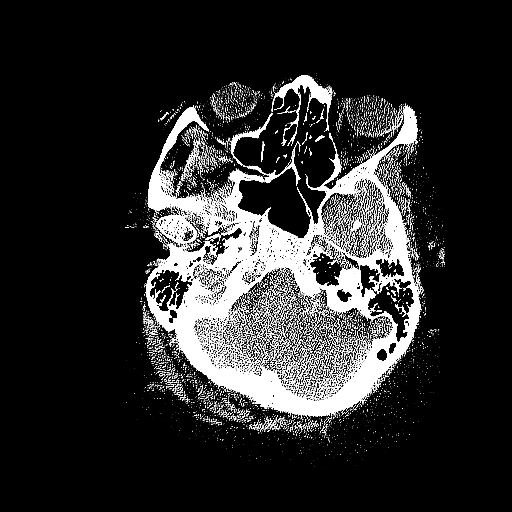
[im 21/82  brain]
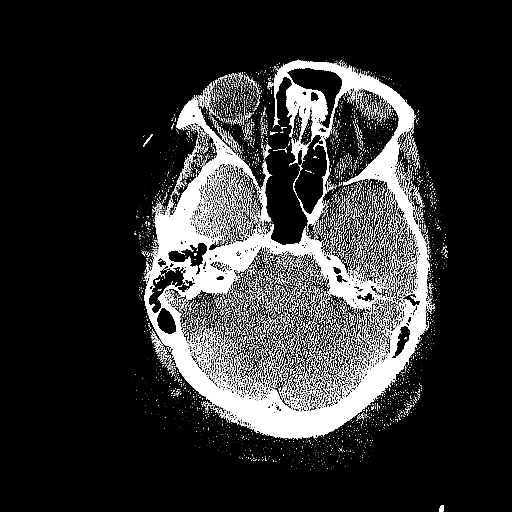
[im 31/82  brain]
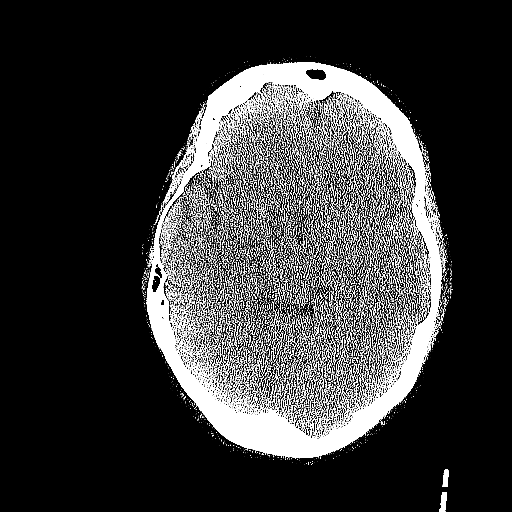
[im 36/82  brain]
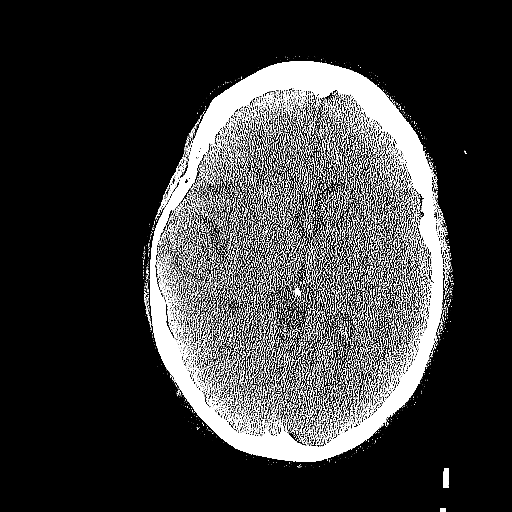
[im 36/82  bone]
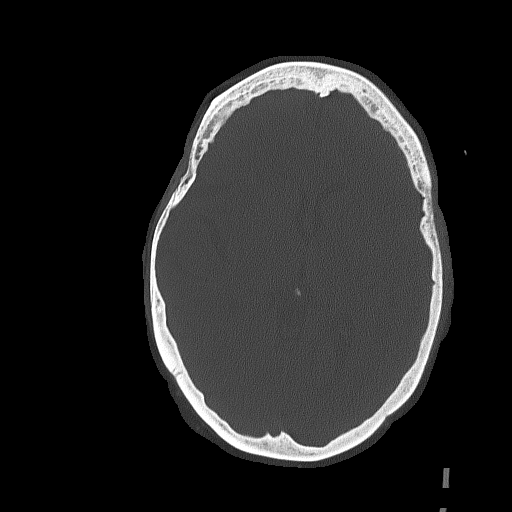
[im 46/82  brain]
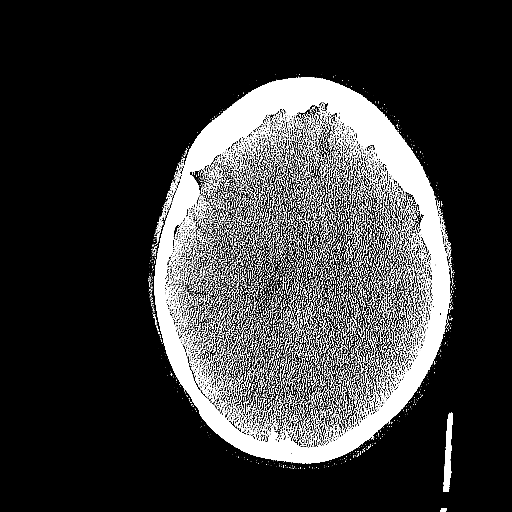
[im 51/82  brain]
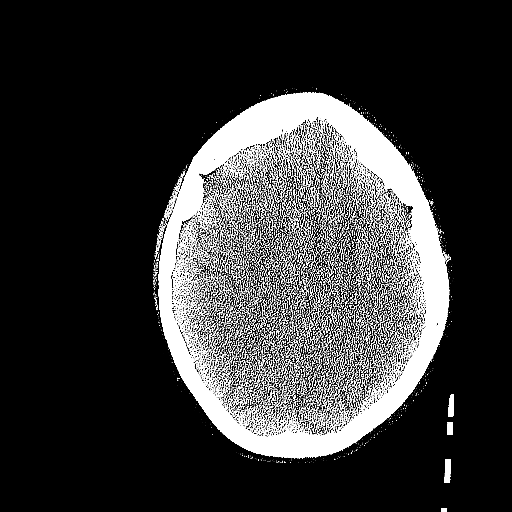
[im 61/82  brain]
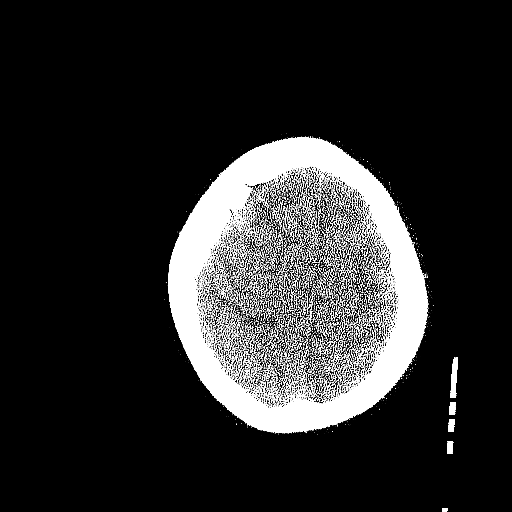
[im 66/82  brain]
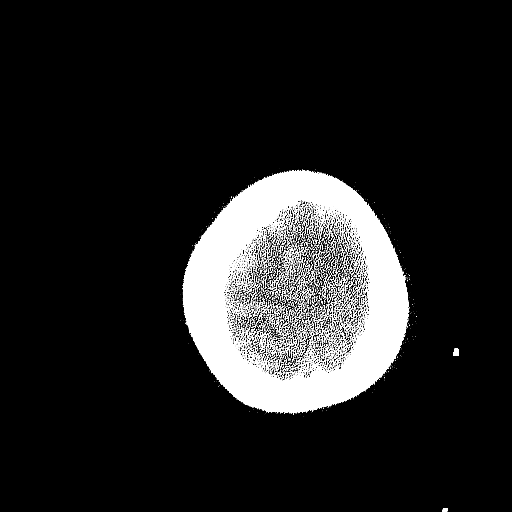
[im 66/82  bone]
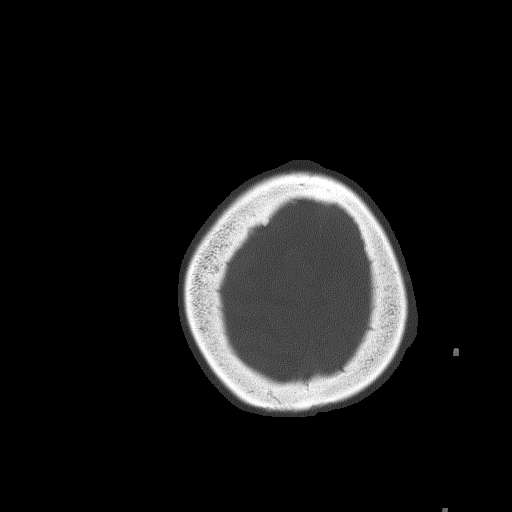
[im 76/82  brain]
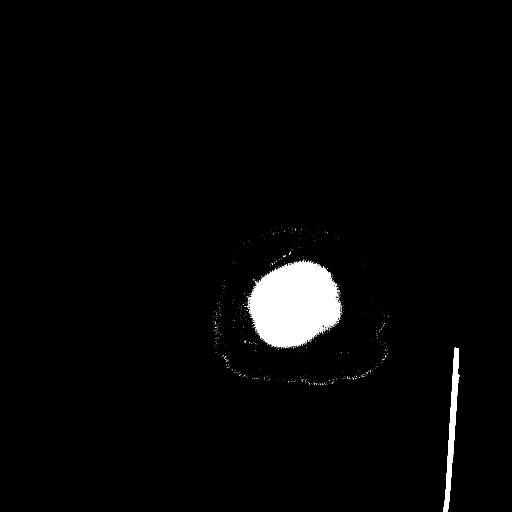

[16 of 47 positions shown; findings below may reference images not displayed]

FINDINGS: Brain: No acute intracranial abnormality. Specifically, no
hemorrhage, hydrocephalus, mass lesion, acute infarction, or
significant intracranial injury.

Vascular: No hyperdense vessel or unexpected calcification.

Skull: No acute calvarial abnormality.

Sinuses/Orbits: Visualized paranasal sinuses and mastoids clear.
Orbital soft tissues unremarkable.

Other: None
IMPRESSION: No acute intracranial abnormality.

## 2019-11-24 MED ORDER — VITAMIN B-12 1000 MCG PO TABS
1000.0000 ug | ORAL_TABLET | Freq: Every day | ORAL | Status: DC
  Administered 2019-11-24 – 2019-12-02 (×9): 1000 ug via ORAL
  Filled 2019-11-24 (×9): qty 1

## 2019-11-24 MED ORDER — LACTATED RINGERS IV BOLUS: 1000.0000 mL | Freq: Once | INTRAVENOUS | Status: DC

## 2019-11-24 MED ORDER — VITAMIN D 25 MCG (1000 UNIT) PO TABS
1000.0000 [IU] | ORAL_TABLET | Freq: Every day | ORAL | Status: DC
  Administered 2019-11-24 – 2019-12-02 (×9): 1000 [IU] via ORAL
  Filled 2019-11-24 (×9): qty 1

## 2019-11-24 MED ORDER — DM-GUAIFENESIN ER 30-600 MG PO TB12: 1.0000 | ORAL_TABLET | Freq: Two times a day (BID) | ORAL | Status: DC | PRN

## 2019-11-24 MED ORDER — APIXABAN 5 MG PO TABS
5.0000 mg | ORAL_TABLET | Freq: Two times a day (BID) | ORAL | Status: DC
  Administered 2019-11-24 – 2019-12-02 (×16): 5 mg via ORAL
  Filled 2019-11-24 (×16): qty 1

## 2019-11-24 MED ORDER — ACETAMINOPHEN 325 MG PO TABS: 650.0000 mg | ORAL_TABLET | Freq: Four times a day (QID) | ORAL | Status: DC | PRN

## 2019-11-24 MED ORDER — PAROXETINE HCL 30 MG PO TABS
30.0000 mg | ORAL_TABLET | Freq: Every day | ORAL | Status: DC
  Administered 2019-11-24 – 2019-12-02 (×9): 30 mg via ORAL
  Filled 2019-11-24 (×9): qty 1

## 2019-11-24 MED ORDER — ALBUTEROL SULFATE (2.5 MG/3ML) 0.083% IN NEBU: 2.5000 mg | INHALATION_SOLUTION | RESPIRATORY_TRACT | Status: DC | PRN

## 2019-11-24 MED ORDER — IOHEXOL 350 MG/ML SOLN
60.0000 mL | Freq: Once | INTRAVENOUS | Status: AC | PRN
  Administered 2019-11-24: 60 mL via INTRAVENOUS

## 2019-11-24 MED ORDER — LACTATED RINGERS IV BOLUS
500.0000 mL | Freq: Once | INTRAVENOUS | Status: AC
  Administered 2019-11-24: 500 mL via INTRAVENOUS

## 2019-11-24 MED ORDER — ATENOLOL 50 MG PO TABS
50.0000 mg | ORAL_TABLET | Freq: Every day | ORAL | Status: DC
  Administered 2019-11-24 – 2019-11-28 (×5): 50 mg via ORAL
  Filled 2019-11-24 (×2): qty 1
  Filled 2019-11-24: qty 2
  Filled 2019-11-24 (×3): qty 1

## 2019-11-24 MED ORDER — IPRATROPIUM-ALBUTEROL 0.5-2.5 (3) MG/3ML IN SOLN
3.0000 mL | Freq: Two times a day (BID) | RESPIRATORY_TRACT | Status: DC
  Administered 2019-11-25 – 2019-12-02 (×13): 3 mL via RESPIRATORY_TRACT
  Filled 2019-11-24 (×13): qty 3

## 2019-11-24 MED ORDER — ONDANSETRON HCL 4 MG/2ML IJ SOLN: 4.0000 mg | Freq: Three times a day (TID) | INTRAMUSCULAR | Status: DC | PRN

## 2019-11-24 MED ORDER — HYDRALAZINE HCL 20 MG/ML IJ SOLN: 5.0000 mg | INTRAMUSCULAR | Status: DC | PRN

## 2019-11-24 MED ORDER — MECLIZINE HCL 25 MG PO TABS
25.0000 mg | ORAL_TABLET | Freq: Three times a day (TID) | ORAL | Status: DC | PRN
  Filled 2019-11-24: qty 1

## 2019-11-24 MED ORDER — LACTATED RINGERS IV SOLN: INTRAVENOUS | Status: DC

## 2019-11-24 MED ORDER — IPRATROPIUM-ALBUTEROL 0.5-2.5 (3) MG/3ML IN SOLN
3.0000 mL | RESPIRATORY_TRACT | Status: DC
  Administered 2019-11-24 (×2): 3 mL via RESPIRATORY_TRACT
  Filled 2019-11-24 (×2): qty 3

## 2019-11-24 NOTE — ED Notes (Signed)
Pt in Ct  

## 2019-11-24 NOTE — ED Notes (Signed)
Pt states that she does not have oxygen at home but also states that she is usually wearing the nasal cannula. Poor historian.

## 2019-11-24 NOTE — ED Triage Notes (Addendum)
To ED via [facility] from home. Daughter called EMS due to AMS. Pt states that nothing is wrong with her but does report hallucinations and urinary frequency over the last few days. Alert to self, place, situation and disoriented to time. CBG 164, 147/59, 83HR, 88% on RA-placed on 2L by EMS, 98.4 temperature.

## 2019-11-24 NOTE — ED Notes (Signed)
Daughter at bedside, states pt does not wear home oxygen

## 2019-11-24 NOTE — H&P (Signed)
History and Physical    Kathleen BayleyGladys W Barton ZOX:[id number] DOB: 02/09/1941 DOA: 11/24/2019  Referring MD/NP/PA:   PCP: [facility]   Patient coming from:  The patient is coming from home.  At baseline, pt is independent for most of ADL.        Chief Complaint: Altered mental status  HPI: Kathleen Barton is a 79 y.o. female with medical history significant of hypertension, depression, anxiety, DVT on Eliquis, vertigo, obesity, CKD-2, morbid obesity, urinary incontinence, who presents with altered mental status.  Per her daughter-in-law, patient has been confused in the past 2 days.  She has hallucination. She has been seeing people who are not there including dead relative.  Patient does not have unilateral numbness or tingling in extremities, no facial droop or slurred speech.  Per her daughter in law, patient is not wearing oxygen at home, but she has shortness breath and oxygen desaturation on mild exertion recently.  Patient does not have chest pain, cough, fever or chills.  No nausea vomiting, diarrhea, abdominal pain.  Patient has urinary frequency, but no dysuria or burning on urination.  Patient was found to have oxygen desaturation to 88% on room air, which improved to 97% on 2 L nasal cannula oxygen in ED.  ED Course: pt was found to have WBC 11.4, BNP and 3.8, urinalysis (hazy appearance, negative leukocyte, negative bacteria, WBC 6-10, red blood cell >50), INR 1.3, worsening renal function, temperature normal, blood pressure 156/57, heart rate 72, RR 20  Review of Systems: Could not be reviewed due to altered mental status  Allergy: No Known Allergies  Past Medical History:  Diagnosis Date  . Hypertension   . Urinary incontinence     Past Surgical History:  Procedure Laterality Date  . ABDOMINAL HYSTERECTOMY  1974    Social History:  reports that she has never smoked. She has never used smokeless tobacco. She reports that she does not drink alcohol and does not use  drugs.  Family History:  Family History  Problem Relation Age of Onset  . Heart disease Other   . Hypertension Other      Prior to Admission medications   Medication Sig Start Date End Date Taking? Authorizing Provider  apixaban (ELIQUIS) 5 MG TABS tablet Take 1 tablet (5 mg total) by mouth 2 (two) times daily. 10/08/19   Glori LuisSonnenberg, Eric G, MD  atenolol-chlorthalidone (TENORETIC) 50-25 MG tablet Take 1 tablet by mouth daily. for blood pressure 10/08/19   Glori LuisSonnenberg, Eric G, MD  cholecalciferol (VITAMIN D) 1000 units tablet Take 1,000 Units by mouth daily.    [provider]  cyanocobalamin 1000 MCG tablet Take 1,000 mcg by mouth daily.    [provider]  meclizine (ANTIVERT) 25 MG tablet Take 25 mg by mouth 3 (three) times daily as needed for dizziness.    [provider]  PARoxetine (PAXIL) 30 MG tablet Take 1 tablet (30 mg total) by mouth daily. 10/08/19   Glori LuisSonnenberg, Eric G, MD    Physical Exam: Vitals:   11/24/19 0716 11/24/19 0730 11/24/19 1000 11/24/19 1030  BP:  (!) 156/57 (!) 141/106 (!) 122/51  Pulse:  72 66 68  Resp:  22 21 20   Temp: 97.8 F (36.6 C)     TempSrc: Oral     SpO2:  97% 93% 92%  Weight:      Height:       General: Not in acute distress HEENT:       Eyes: PERRL, EOMI,  no scleral icterus.       ENT: No discharge from the ears and nose, no pharynx injection, no tonsillar enlargement.        Neck: No JVD, no bruit, no mass felt. Heme: No neck lymph node enlargement. Cardiac: S1/S2, RRR, No murmurs, No gallops or rubs. Respiratory:  No rales, wheezing, rhonchi or rubs. GI: Soft, nondistended, nontender, no rebound pain, no organomegaly, BS present. GU: No hematuria Ext: 2+ pitting leg edema bilaterally.  Has chronic venous insufficiency change in both legs. 1+DP/PT pulse bilaterally. Musculoskeletal: No joint deformities, No joint redness or warmth, no limitation of ROM in spin. Skin: No skin tears Neuro: Confused, knows her own  name, not orientated to time and place, not following command. Cranial nerves II-XII grossly intact, moves all extremities normally. Psych: Patient is not psychotic, no suicidal or hemocidal ideation.  Labs on Admission: I have personally reviewed following labs and imaging studies  CBC: Recent Labs  Lab 11/24/19 0722  WBC 11.4*  NEUTROABS 9.4*  HGB 14.6  HCT 46.3*  MCV 96.5  PLT 246   Basic Metabolic Panel: Recent Labs  Lab 11/24/19 0722  NA 138  K 3.7  CL 97*  CO2 29  GLUCOSE 157*  BUN 39*  CREATININE 1.32*  CALCIUM 9.1  MG 2.1   GFR: Estimated Creatinine Clearance: 54.3 mL/min (A) (by C-G formula based on SCr of 1.32 mg/dL (H)). Liver Function Tests: Recent Labs  Lab 11/24/19 0722  AST 34  ALT 24  ALKPHOS 54  BILITOT 1.4*  PROT 8.1  ALBUMIN 3.8   No results for input(s): LIPASE, AMYLASE in the last 168 hours. Recent Labs  Lab 11/24/19 0722  AMMONIA 19   Coagulation Profile: Recent Labs  Lab 11/24/19 0813  INR 1.3*   Cardiac Enzymes: No results for input(s): CKTOTAL, CKMB, CKMBINDEX, TROPONINI in the last 168 hours. BNP (last 3 results) No results for input(s): PROBNP in the last 8760 hours. HbA1C: No results for input(s): HGBA1C in the last 72 hours. CBG: No results for input(s): GLUCAP in the last 168 hours. Lipid Profile: No results for input(s): CHOL, HDL, LDLCALC, TRIG, CHOLHDL, LDLDIRECT in the last 72 hours. Thyroid Function Tests: Recent Labs    11/24/19 0722  TSH 0.998   Anemia Panel: No results for input(s): VITAMINB12, FOLATE, FERRITIN, TIBC, IRON, RETICCTPCT in the last 72 hours. Urine analysis:    Component Value Date/Time   COLORURINE YELLOW (A) 11/24/2019 0935   APPEARANCEUR HAZY (A) 11/24/2019 0935   LABSPEC 1.030 11/24/2019 0935   PHURINE 5.0 11/24/2019 0935   GLUCOSEU NEGATIVE 11/24/2019 0935   HGBUR LARGE (A) 11/24/2019 0935   BILIRUBINUR NEGATIVE 11/24/2019 0935   BILIRUBINUR negative 06/08/2017 1407   KETONESUR  5 (A) 11/24/2019 0935   PROTEINUR NEGATIVE 11/24/2019 0935   UROBILINOGEN 0.2 06/08/2017 1407   NITRITE NEGATIVE 11/24/2019 0935   LEUKOCYTESUR NEGATIVE 11/24/2019 0935   Sepsis Labs: @LABRCNTIP (procalcitonin:4,lacticidven:4) ) Recent Results (from the past 240 hour(s))  SARS Coronavirus 2 by RT PCR (hospital order, performed in [facility] hospital lab) Nasopharyngeal Nasopharyngeal Swab     Status: None   Collection Time: 11/24/19  7:22 AM   Specimen: Nasopharyngeal Swab  Result Value Ref Range Status   SARS Coronavirus 2 NEGATIVE NEGATIVE Final    Comment: (NOTE) SARS-CoV-2 target nucleic acids are NOT DETECTED.  The SARS-CoV-2 RNA is generally detectable in upper and lower respiratory specimens during the acute phase of infection. The lowest concentration of SARS-CoV-2 viral copies  this assay can detect is 250 copies / mL. A negative result does not preclude SARS-CoV-2 infection and should not be used as the sole basis for treatment or other patient management decisions.  A negative result may occur with improper specimen collection / handling, submission of specimen other than nasopharyngeal swab, presence of viral mutation(s) within the areas targeted by this assay, and inadequate number of viral copies (<250 copies / mL). A negative result must be combined with clinical observations, patient history, and epidemiological information.  Fact Sheet for Patients:   BoilerBrush.com.cy  Fact Sheet for Healthcare Providers: https://Honea.com/  This test is not yet approved or  cleared by the [place] FDA and has been authorized for detection and/or diagnosis of SARS-CoV-2 by FDA under an Emergency Use Authorization (EUA).  This EUA will remain in effect (meaning this test can be used) for the duration of the COVID-19 declaration under Section 564(b)(1) of the Act, 21 U.S.C. section 360bbb-3(b)(1), unless the authorization is  terminated or revoked sooner.  Performed at [facility], [address] [place], [place] [address]      Radiological Exams on Admission: CT Head Wo Contrast  Result Date: 11/24/2019 CLINICAL DATA:  Mental status changes EXAM: CT HEAD WITHOUT CONTRAST TECHNIQUE: Contiguous axial images were obtained from the base of the skull through the vertex without intravenous contrast. COMPARISON:  None. FINDINGS: Brain: No acute intracranial abnormality. Specifically, no hemorrhage, hydrocephalus, mass lesion, acute infarction, or significant intracranial injury. Vascular: No hyperdense vessel or unexpected calcification. Skull: No acute calvarial abnormality. Sinuses/Orbits: Visualized paranasal sinuses and mastoids clear. Orbital soft tissues unremarkable. Other: None IMPRESSION: No acute intracranial abnormality. Electronically Signed   By: Charlett Nose M.D.   On: 11/24/2019 08:49   CT Angio Chest PE W and/or Wo Contrast  Result Date: 11/24/2019 CLINICAL DATA:  Hypoxia EXAM: CT ANGIOGRAPHY CHEST WITH CONTRAST TECHNIQUE: Multidetector CT imaging of the chest was performed using the standard protocol during bolus administration of intravenous contrast. Multiplanar CT image reconstructions and MIPs were obtained to evaluate the vascular anatomy. CONTRAST:  69mL OMNIPAQUE IOHEXOL 350 MG/ML SOLN COMPARISON:  None. FINDINGS: Cardiovascular: Suboptimal study due to respiratory motion. No large/central pulmonary emboli. Difficult to assess for peripheral emboli due to motion artifact. Heart is mildly enlarged. Coronary artery and scattered aortic calcifications. No aneurysm. Mediastinum/Nodes: No mediastinal, hilar, or axillary adenopathy. Trachea and esophagus are unremarkable. Thyroid unremarkable. Lungs/Pleura: Vascular congestion. No confluent opacity. Dependent/bibasilar atelectasis. No effusions. Upper Abdomen: Imaging into the upper abdomen shows no acute findings. Musculoskeletal: Chest wall soft  tissues are unremarkable. No acute bony abnormality. Review of the MIP images confirms the above findings. IMPRESSION: Suboptimal study due to patient's/respiratory motion. No large or central pulmonary emboli seen. Cardiomegaly, vascular congestion. Coronary artery disease. Aortic Atherosclerosis (ICD10-I70.0). Electronically Signed   By: Charlett Nose M.D.   On: 11/24/2019 08:48     EKG: Independently reviewed.  Sinus rhythm, QTC 481, low voltage, poor R wave progression, anteroseptal infarction pattern  Assessment/Plan Principal Problem:   Acute metabolic encephalopathy Active Problems:   History of DVT of lower extremity   Essential hypertension   Morbid obesity with BMI of 50.0-59.9, adult (HCC)   Leukocytosis   Anxiety and depression   History of vertigo   Acute respiratory failure with hypoxia (HCC)   Acute renal failure superimposed on stage 2 chronic kidney disease (HCC)   Hematuria   Acute metabolic encephalopathy: Etiology is not clear.  CT head negative for acute intracranial abnormalities.  Patient does not have focal neuro deficit, low suspicions for stroke.  Urinalysis not impressive.  Ammonia 19. May be due to hypoxia secondary to acute respiratory failure with hypoxia.  -Placed on progressive plan for observation -Frequent neuro check -Fall precaution -Nasal cannula oxygen to maintain oxygen saturation above 93%  Acute respiratory failure with hypoxia: Etiology is not clear.  CT angiogram is suboptimal, but is negative for central PE.  COVID-19 PCR negative.  Potential differential diagnosis include OSA /hypoventilation given her morbid obesity and CHF.  CT angiogram showed cardiomegaly and vascular congestion.  Her BNP is 93, which could be falsely low due to obesity.  Patient has 2+ leg edema.  Due to worsening renal function, will hold off diuretics now. -Get 2D echo -Nasal cannula oxygen to maintain oxygen saturation above 93% -Try CPAP  History of DVT of lower  extremity -Continue Eliquis   Essential hypertension -IV hydralazine as needed -Hold chlorthalidone due to worsening renal function -Continue atenolol 50 mg daily  Morbid obesity with BMI of 50.0-59.9, adult (HCC): BMI 52.6 -Diet and exercise.   -Encouraged to lose weight.  -Sleep study as outpatient  Leukocytosis: WBC 11.4, no source of infection identified.  No fever.  Likely reactive -Follow-up of CBC  Anxiety and depression -Paxil  History of vertigo -Meclizine  Acute renal failure superimposed on stage 2 chronic kidney disease (HCC): Baseline creatinine ~1.0.  Her creatinine is 1.32, BUN 39. -Hold chlorthalidone -Patient received 500 cc of LR in ED -will not give more IVF due to suspected CHF  Hematuria: Urinalysis showed RBC >50, likely due to Eliquis use.  Since hemoglobin is a stable 14.6, will continue Eliquis. -Follow-up with PCP -If continues to have hematuria, will need to follow-up with urology.     DVT ppx: Eliquis Code Status: DNR per her daughter -in law  Family Communication:   Yes, patient's daughter-in law   at bed side Disposition Plan:  Anticipate discharge back to previous environment Consults called:  None Admission status:    progressive unit for obs        Status is: Observation  The patient remains OBS appropriate and will d/c before 2 midnights.  Dispo: The patient is from: Home              Anticipated d/c is to: Home              Anticipated d/c date is: 1 day              Patient currently is not medically stable to d/c.           Date of Service 11/24/2019    Lorretta Harp [facility]   If 7PM-7AM, please contact night-coverage [email or web address] 11/24/2019, 11:33 AM

## 2019-11-24 NOTE — ED Notes (Signed)
Pt resting peacefully at this time.

## 2019-11-24 NOTE — ED Provider Notes (Signed)
[facility] Emergency Department Provider Note  ____________________________________________   First MD Initiated Contact with Patient 11/24/19 [phone]     (approximate)  I have reviewed the triage vital signs and the nursing notes.   HISTORY  Chief Complaint Altered Mental Status   HPI Kathleen Barton is a 79 y.o. 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  Per patient she has been seeing people who are not there including dead relatives for the last 2 or 3 days.  She denies any pain, cough, shortness of breath, falls, fevers, burning with urination, rash, or other acute symptoms.  Further history is limited, patient secondary to her altered mental status.  I was able to reach patient's daughter who stated the patient was very sedentary at baseline and that she had noticed the patient had become more disorganized over the last 2 or 3 days complaining of hallucinations.  Patient's daughter also states the patient does not wear oxygen at baseline and she suspects the patient has not been taking her medications because there have been various amounts in her pill bottles do not match up with what she is supposed to take.  No witnessed falls or other clear precipitating events.  No prior similar episodes.  No other clear alleviating aggravating factors.         Past Medical History:  Diagnosis Date  . Hypertension   . Urinary incontinence     Patient Active Problem List   Diagnosis Date Noted  . Acute metabolic encephalopathy 11/24/2019  . Acute respiratory failure with hypoxia (HCC) 11/24/2019  . Acute renal failure superimposed on stage 2 chronic kidney disease (HCC) 11/24/2019  . Prediabetes 11/22/2018  . Leukocytosis 07/26/2018  . Anxiety and depression 07/26/2018  . History of vertigo 07/26/2018  .  Urge incontinence of urine 06/08/2017  . Abnormal mammogram 06/08/2017  . Morbid obesity (HCC) 02/18/2016  . B12 deficiency 10/14/2015  . History of DVT of lower extremity 08/27/2015  . Weakness of both lower extremities 08/27/2015  . Essential hypertension 08/27/2015    Past Surgical History:  Procedure Laterality Date  . ABDOMINAL HYSTERECTOMY  1974    Prior to Admission medications   Medication Sig Start Date End Date Taking? Authorizing Provider  apixaban (ELIQUIS) 5 MG TABS tablet Take 1 tablet (5 mg total) by mouth 2 (two) times daily. 10/08/19   Glori Luis, MD  atenolol-chlorthalidone (TENORETIC) 50-25 MG tablet Take 1 tablet by mouth daily. for blood pressure 10/08/19   Glori Luis, MD  cholecalciferol (VITAMIN D) 1000 units tablet Take 1,000 Units by mouth daily.    [provider]  cyanocobalamin 1000 MCG tablet Take 1,000 mcg by mouth daily.    [provider]  meclizine (ANTIVERT) 25 MG tablet Take 25 mg by mouth 3 (three) times daily as needed for dizziness.    [provider]  PARoxetine (PAXIL) 30 MG tablet Take 1 tablet (30 mg total) by mouth daily. 10/08/19   Glori Luis, MD    Allergies Patient has no known allergies.  Family History  Problem Relation Age of Onset  . Heart disease Other   . Hypertension Other     Social History Social History   Tobacco Use  . Smoking status: Never Smoker  . Smokeless tobacco: Never Used  Substance Use Topics  . Alcohol use:  No    Alcohol/week: 0.0 standard drinks  . Drug use: No    Review of Systems  Review of Systems  Unable to perform ROS: Mental status change      ____________________________________________   PHYSICAL EXAM:  VITAL SIGNS: ED Triage Vitals  Enc Vitals Group     BP 11/24/19 0712 (!) 140/69     Pulse Rate 11/24/19 0712 82     Resp 11/24/19 0712 18     Temp --      Temp src --      SpO2 11/24/19 0712 (!) 88 %     Weight 11/24/19 0713 (!)  346 lb 2 oz (157 kg)     Height 11/24/19 0713 5\' 8"  (1.727 m)     Head Circumference --      Peak Flow --      Pain Score 11/24/19 0713 0     Pain Loc --      Pain Edu? --      Excl. in GC? --    Vitals:   11/24/19 0716 11/24/19 0730  BP:  (!) 156/57  Pulse:  72  Resp:  22  Temp: 97.8 F (36.6 C)   SpO2:  97%   Physical Exam Vitals and nursing note reviewed.  Constitutional:      Appearance: She is obese.  HENT:     Head: Normocephalic and atraumatic.     Right Ear: External ear normal.     Left Ear: External ear normal.     Nose: Nose normal.     Mouth/Throat:     Mouth: Mucous membranes are moist.  Eyes:     Extraocular Movements: Extraocular movements intact.     Conjunctiva/sclera: Conjunctivae normal.     Pupils: Pupils are equal, round, and reactive to light.  Cardiovascular:     Rate and Rhythm: Regular rhythm.  Pulmonary:     Effort: Pulmonary effort is normal. No respiratory distress.     Breath sounds: No stridor. No wheezing, rhonchi or rales.  Chest:     Chest wall: No tenderness.  Skin:    Capillary Refill: Capillary refill takes less than 2 seconds.  Neurological:     General: No focal deficit present.     Comments: Patient is able to follow commands in all extremities with symmetric strength.  Sensation is intact to light touch all extremities.  PERRLA.  EOMI.  There is no facial droop.  Patient is not oriented to year or date.  Psychiatric:        Mood and Affect: Mood normal.      ____________________________________________   LABS (all labs ordered are listed, but only abnormal results are displayed)  Labs Reviewed  CBC WITH DIFFERENTIAL/PLATELET - Abnormal; Notable for the following components:      Result Value   WBC 11.4 (*)    HCT 46.3 (*)    Neutro Abs 9.4 (*)    Abs Immature Granulocytes 0.08 (*)    All other components within normal limits  COMPREHENSIVE METABOLIC PANEL - Abnormal; Notable for the following components:    Chloride 97 (*)    Glucose, Bld 157 (*)    BUN 39 (*)    Creatinine, Ser 1.32 (*)    Total Bilirubin 1.4 (*)    GFR calc non Af Amer 38 (*)    GFR calc Af Amer 44 (*)    All other components within normal limits  URINALYSIS, COMPLETE (UACMP) WITH MICROSCOPIC - Abnormal; Notable  for the following components:   Color, Urine YELLOW (*)    APPearance HAZY (*)    Hgb urine dipstick LARGE (*)    Ketones, ur 5 (*)    RBC / HPF >50 (*)    All other components within normal limits  BLOOD GAS, VENOUS - Abnormal; Notable for the following components:   pCO2, Ven 61 (*)    pO2, Ven 51.0 (*)    Bicarbonate 32.9 (*)    Acid-Base Excess 5.2 (*)    All other components within normal limits  PROTIME-INR - Abnormal; Notable for the following components:   Prothrombin Time 15.9 (*)    INR 1.3 (*)    All other components within normal limits  SARS CORONAVIRUS 2 BY RT PCR (HOSPITAL ORDER, PERFORMED IN [facility])  MAGNESIUM  AMMONIA  TSH  BRAIN NATRIURETIC PEPTIDE  TROPONIN I (HIGH SENSITIVITY)   ____________________________________________  EKG  Sinus rhythm with ventricular rate of 77, normal axis, slightly prolonged QTc interval and nonspecific T wave changes in the anterior and inferior leads with no other clear evidence of acute ischemia or other significant underlying arrhythmia. ____________________________________________  RADIOLOGY   Official radiology report(s): CT Head Wo Contrast  Result Date: 11/24/2019 CLINICAL DATA:  Mental status changes EXAM: CT HEAD WITHOUT CONTRAST TECHNIQUE: Contiguous axial images were obtained from the base of the skull through the vertex without intravenous contrast. COMPARISON:  None. FINDINGS: Brain: No acute intracranial abnormality. Specifically, no hemorrhage, hydrocephalus, mass lesion, acute infarction, or significant intracranial injury. Vascular: No hyperdense vessel or unexpected calcification. Skull: No acute calvarial  abnormality. Sinuses/Orbits: Visualized paranasal sinuses and mastoids clear. Orbital soft tissues unremarkable. Other: None IMPRESSION: No acute intracranial abnormality. Electronically Signed   By: Charlett Nose M.D.   On: 11/24/2019 08:49   CT Angio Chest PE W and/or Wo Contrast  Result Date: 11/24/2019 CLINICAL DATA:  Hypoxia EXAM: CT ANGIOGRAPHY CHEST WITH CONTRAST TECHNIQUE: Multidetector CT imaging of the chest was performed using the standard protocol during bolus administration of intravenous contrast. Multiplanar CT image reconstructions and MIPs were obtained to evaluate the vascular anatomy. CONTRAST:  8mL OMNIPAQUE IOHEXOL 350 MG/ML SOLN COMPARISON:  None. FINDINGS: Cardiovascular: Suboptimal study due to respiratory motion. No large/central pulmonary emboli. Difficult to assess for peripheral emboli due to motion artifact. Heart is mildly enlarged. Coronary artery and scattered aortic calcifications. No aneurysm. Mediastinum/Nodes: No mediastinal, hilar, or axillary adenopathy. Trachea and esophagus are unremarkable. Thyroid unremarkable. Lungs/Pleura: Vascular congestion. No confluent opacity. Dependent/bibasilar atelectasis. No effusions. Upper Abdomen: Imaging into the upper abdomen shows no acute findings. Musculoskeletal: Chest wall soft tissues are unremarkable. No acute bony abnormality. Review of the MIP images confirms the above findings. IMPRESSION: Suboptimal study due to patient's/respiratory motion. No large or central pulmonary emboli seen. Cardiomegaly, vascular congestion. Coronary artery disease. Aortic Atherosclerosis (ICD10-I70.0). Electronically Signed   By: Charlett Nose M.D.   On: 11/24/2019 08:48    ____________________________________________   PROCEDURES  Procedure(s) performed (including Critical Care):  .1-3 Lead EKG Interpretation Performed by: Gilles Chiquito, MD Authorized by: Gilles Chiquito, MD     Interpretation: normal     ECG rate assessment: normal      Rhythm: sinus rhythm     Ectopy: none     Conduction: normal   .Critical Care Performed by: Gilles Chiquito, MD Authorized by: Gilles Chiquito, MD   Critical care provider statement:    Critical care time (minutes):  45   Critical care  time was exclusive of:  Separately billable procedures and treating other patients   Critical care was necessary to treat or prevent imminent or life-threatening deterioration of the following conditions:  Respiratory failure   Critical care was time spent personally by me on the following activities:  Discussions with consultants, evaluation of patient's response to treatment, examination of patient, ordering and performing treatments and interventions, ordering and review of laboratory studies, ordering and review of radiographic studies, pulse oximetry, re-evaluation of patient's condition, obtaining history from patient or surrogate and review of old charts     ____________________________________________   INITIAL IMPRESSION / ASSESSMENT AND PLAN / ED COURSE        Overall unclear etiology for patient's altered mental status and hallucinations as well as her new O2 requirement.  There is no obvious source of infection on exam, evidence of pneumonia on CTA chest, and urine does not appear consistent with cystitis.  Low suspicion for toxic ingestion and has no evidence of significant thyroid derangement, elevated ammonia, or other significant metabolic derangement.  In addition her presentation is not consistent with CVA, intracranial mass, or intracranial bleed..  With regard to her SPO2 saturations also unclear etiology at this time.  No evidence of pneumonia or PE on CTA chest.  ECG and troponins are not consistent with ACS and patient has no evidence of hypercarbic respiratory failure on VBG.  She does not appear volume overloaded on exam and BNP is not elevated and stable low suspicion for CHF at this time.  Given new O2 requirement with unclear  etiology as well as encephalopathy possibly secondary to elevated BUN versus other endocrinology at this time will plan to admit to hospital service for further evaluation and management.          ____________________________________________   FINAL CLINICAL IMPRESSION(S) / ED DIAGNOSES  Final diagnoses:  Acute respiratory failure with hypoxia (HCC)  Altered mental status, unspecified altered mental status type  Uremia  AKI (acute kidney injury) (HCC)  Hematuria, unspecified type     ED Discharge Orders    None       Note:  This document was prepared using Dragon voice recognition software and may include unintentional dictation errors.   Gilles Chiquito, MD 11/24/19 1021

## 2019-11-24 NOTE — ED Notes (Signed)
Attempted to in and out cath X 3 by this RN and EDT Dorian with Les, EMTP assistance. Unable to collect sample. pericare provided and purewick placed on patient.

## 2019-11-25 ENCOUNTER — Other Ambulatory Visit: Payer: Self-pay

## 2019-11-25 ENCOUNTER — Observation Stay (HOSPITAL_COMMUNITY)
Admit: 2019-11-25 | Discharge: 2019-11-25 | Disposition: A | Discharge status: Home / Self Care | Payer: Medicare Other | Attending: Internal Medicine | Admitting: Internal Medicine

## 2019-11-25 DIAGNOSIS — I130 Hypertensive heart and chronic kidney disease with heart failure and stage 1 through stage 4 chronic kidney disease, or unspecified chronic kidney disease: Secondary | ICD-10-CM | POA: Diagnosis present

## 2019-11-25 DIAGNOSIS — G9341 Metabolic encephalopathy: Secondary | ICD-10-CM | POA: Diagnosis not present

## 2019-11-25 DIAGNOSIS — R278 Other lack of coordination: Secondary | ICD-10-CM | POA: Diagnosis not present

## 2019-11-25 DIAGNOSIS — D72829 Elevated white blood cell count, unspecified: Secondary | ICD-10-CM | POA: Diagnosis present

## 2019-11-25 DIAGNOSIS — M2550 Pain in unspecified joint: Secondary | ICD-10-CM | POA: Diagnosis not present

## 2019-11-25 DIAGNOSIS — F329 Major depressive disorder, single episode, unspecified: Secondary | ICD-10-CM | POA: Diagnosis present

## 2019-11-25 DIAGNOSIS — R350 Frequency of micturition: Secondary | ICD-10-CM | POA: Diagnosis present

## 2019-11-25 DIAGNOSIS — Z6841 Body Mass Index (BMI) 40.0 and over, adult: Secondary | ICD-10-CM | POA: Diagnosis not present

## 2019-11-25 DIAGNOSIS — Z79899 Other long term (current) drug therapy: Secondary | ICD-10-CM | POA: Diagnosis not present

## 2019-11-25 DIAGNOSIS — R319 Hematuria, unspecified: Secondary | ICD-10-CM | POA: Diagnosis present

## 2019-11-25 DIAGNOSIS — Z8249 Family history of ischemic heart disease and other diseases of the circulatory system: Secondary | ICD-10-CM | POA: Diagnosis not present

## 2019-11-25 DIAGNOSIS — J9622 Acute and chronic respiratory failure with hypercapnia: Secondary | ICD-10-CM | POA: Diagnosis present

## 2019-11-25 DIAGNOSIS — Z7901 Long term (current) use of anticoagulants: Secondary | ICD-10-CM | POA: Diagnosis not present

## 2019-11-25 DIAGNOSIS — R2681 Unsteadiness on feet: Secondary | ICD-10-CM | POA: Diagnosis not present

## 2019-11-25 DIAGNOSIS — R32 Unspecified urinary incontinence: Secondary | ICD-10-CM | POA: Diagnosis present

## 2019-11-25 DIAGNOSIS — Z20822 Contact with and (suspected) exposure to covid-19: Secondary | ICD-10-CM | POA: Diagnosis present

## 2019-11-25 DIAGNOSIS — J9601 Acute respiratory failure with hypoxia: Secondary | ICD-10-CM | POA: Diagnosis not present

## 2019-11-25 DIAGNOSIS — Z86718 Personal history of other venous thrombosis and embolism: Secondary | ICD-10-CM | POA: Diagnosis not present

## 2019-11-25 DIAGNOSIS — E662 Morbid (severe) obesity with alveolar hypoventilation: Secondary | ICD-10-CM | POA: Diagnosis present

## 2019-11-25 DIAGNOSIS — R4182 Altered mental status, unspecified: Secondary | ICD-10-CM | POA: Diagnosis present

## 2019-11-25 DIAGNOSIS — I5031 Acute diastolic (congestive) heart failure: Secondary | ICD-10-CM

## 2019-11-25 DIAGNOSIS — Z9119 Patient's noncompliance with other medical treatment and regimen: Secondary | ICD-10-CM | POA: Diagnosis not present

## 2019-11-25 DIAGNOSIS — I5033 Acute on chronic diastolic (congestive) heart failure: Secondary | ICD-10-CM | POA: Diagnosis present

## 2019-11-25 DIAGNOSIS — R0902 Hypoxemia: Secondary | ICD-10-CM | POA: Diagnosis not present

## 2019-11-25 DIAGNOSIS — N179 Acute kidney failure, unspecified: Secondary | ICD-10-CM | POA: Diagnosis not present

## 2019-11-25 DIAGNOSIS — Z90710 Acquired absence of both cervix and uterus: Secondary | ICD-10-CM | POA: Diagnosis not present

## 2019-11-25 DIAGNOSIS — N1831 Chronic kidney disease, stage 3a: Secondary | ICD-10-CM | POA: Diagnosis present

## 2019-11-25 DIAGNOSIS — R531 Weakness: Secondary | ICD-10-CM | POA: Diagnosis not present

## 2019-11-25 DIAGNOSIS — Z7401 Bed confinement status: Secondary | ICD-10-CM | POA: Diagnosis not present

## 2019-11-25 DIAGNOSIS — Z66 Do not resuscitate: Secondary | ICD-10-CM | POA: Diagnosis present

## 2019-11-25 DIAGNOSIS — R441 Visual hallucinations: Secondary | ICD-10-CM | POA: Diagnosis present

## 2019-11-25 DIAGNOSIS — F419 Anxiety disorder, unspecified: Secondary | ICD-10-CM | POA: Diagnosis present

## 2019-11-25 DIAGNOSIS — Z743 Need for continuous supervision: Secondary | ICD-10-CM | POA: Diagnosis not present

## 2019-11-25 DIAGNOSIS — J9621 Acute and chronic respiratory failure with hypoxia: Secondary | ICD-10-CM | POA: Diagnosis not present

## 2019-11-25 DIAGNOSIS — M6281 Muscle weakness (generalized): Secondary | ICD-10-CM | POA: Diagnosis not present

## 2019-11-25 LAB — ECHOCARDIOGRAM COMPLETE
AR max vel: 2.79 cm2
AV Area VTI: 3.3 cm2
AV Area mean vel: 2.99 cm2
AV Mean grad: 5 mmHg
AV Peak grad: 9.2 mmHg
Ao pk vel: 1.52 m/s
Area-P 1/2: 2.69 cm2
Height: 68 in
S' Lateral: 3.01 cm
Weight: 5083.2 oz

## 2019-11-25 LAB — BASIC METABOLIC PANEL
Anion gap: 10 (ref 5–15)
BUN: 31 mg/dL — ABNORMAL HIGH (ref 8–23)
CO2: 32 mmol/L (ref 22–32)
Calcium: 9 mg/dL (ref 8.9–10.3)
Chloride: 97 mmol/L — ABNORMAL LOW (ref 98–111)
Creatinine, Ser: 0.88 mg/dL (ref 0.44–1.00)
GFR calc Af Amer: >60 mL/min (ref >60)
GFR calc non Af Amer: >60 mL/min (ref >60)
Glucose, Bld: 88 mg/dL (ref 70–99)
Potassium: 3.3 mmol/L — ABNORMAL LOW (ref 3.5–5.1)
Sodium: 139 mmol/L (ref 135–145)

## 2019-11-25 LAB — CBC
HCT: 45.1 % (ref 36.0–46.0)
Hemoglobin: 13.8 g/dL (ref 12.0–15.0)
MCH: 30.1 pg (ref 26.0–34.0)
MCHC: 30.6 g/dL (ref 30.0–36.0)
MCV: 98.3 fL (ref 80.0–100.0)
Platelets: 218 10*3/uL (ref 150–400)
RBC: 4.59 MIL/uL (ref 3.87–5.11)
RDW: 14.5 % (ref 11.5–15.5)
WBC: 9.4 10*3/uL (ref 4.0–10.5)
nRBC: 0 % (ref 0.0–0.2)

## 2019-11-25 MED ORDER — CHLORTHALIDONE 25 MG PO TABS
25.0000 mg | ORAL_TABLET | Freq: Every day | ORAL | Status: DC
  Administered 2019-11-26: 25 mg via ORAL
  Filled 2019-11-25: qty 1

## 2019-11-25 MED ORDER — SODIUM CHLORIDE 0.9% FLUSH
3.0000 mL | Freq: Two times a day (BID) | INTRAVENOUS | Status: DC
  Administered 2019-11-25 – 2019-12-02 (×11): 3 mL via INTRAVENOUS

## 2019-11-25 MED ORDER — POTASSIUM CHLORIDE CRYS ER 20 MEQ PO TBCR
40.0000 meq | EXTENDED_RELEASE_TABLET | Freq: Once | ORAL | Status: AC
  Administered 2019-11-25: 40 meq via ORAL
  Filled 2019-11-25: qty 2

## 2019-11-25 NOTE — Hospital Course (Signed)
Kathleen Barton is a 79 y.o. female with medical history significant of hypertension, depression, anxiety, DVT on Eliquis, vertigo, obesity, CKD-2, morbid obesity, urinary incontinence, who presented with altered mental status with 2 days of confusion and hallucinations.  Daughter provided history that patient is noncompliant with oxygen at home, has baseline shortness of breath, and noting oxygen desaturations with minimal exertion recently.   In the ED, afebrile, O2 sat 88% on room air, improved to 97% on 2 L/min nasal cannula.  BP 156/57, RR 20, HR 72.  Labs notable for mild leukocytosis 11.4k, BNP 93.8, BUM 39 and Cr 1.32 (compared to 1.05 in Feb 2019).  UA negative for infection, but had large Hbg and >50 rbc's.  CT head negative for acute findings.  CTA chest ruled out central PE but was suboptimal study due to respiratory motion, showed cardiomegaly and vascular congestion.  Admitted to hospitalist service for further evaluation and management.

## 2019-11-25 NOTE — Progress Notes (Signed)
*  PRELIMINARY RESULTS* Echocardiogram 2D Echocardiogram has been performed.  Kathleen Barton 11/25/2019, 9:42 AM

## 2019-11-25 NOTE — Plan of Care (Signed)

## 2019-11-25 NOTE — Progress Notes (Addendum)
PROGRESS NOTE    Kathleen Barton   FGH:[id number]  DOB: 04/11/1941  PCP: [facility]    DOA: 11/24/2019 LOS: 0   Brief Narrative   Kathleen Barton is a 79 y.o. female with medical history significant of hypertension, depression, anxiety, DVT on Eliquis, vertigo, obesity, CKD-2, morbid obesity, urinary incontinence, who presented with altered mental status with 2 days of confusion and hallucinations.  Daughter provided history that patient is noncompliant with oxygen at home, has baseline shortness of breath, and noting oxygen desaturations with minimal exertion recently.   In the ED, afebrile, O2 sat 88% on room air, improved to 97% on 2 L/min nasal cannula.  BP 156/57, RR 20, HR 72.  Labs notable for mild leukocytosis 11.4k, BNP 93.8, BUM 39 and Cr 1.32 (compared to 1.05 in Feb 2019).  UA negative for infection, but had large Hbg and >50 rbc's.  CT head negative for acute findings.  CTA chest ruled out central PE but was suboptimal study due to respiratory motion, showed cardiomegaly and vascular congestion.  Admitted to hospitalist service for further evaluation and management.     Assessment & Plan   Principal Problem:   Acute metabolic encephalopathy Active Problems:   History of DVT of lower extremity   Essential hypertension   Morbid obesity with BMI of 50.0-59.9, adult (HCC)   Leukocytosis   Anxiety and depression   History of vertigo   Acute respiratory failure with hypoxia (HCC)   Acute renal failure superimposed on stage 2 chronic kidney disease (HCC)   Hematuria   Acute respiratory failure with hypoxia and hypercarbia -most likely secondary to untreated OSA and obesity hypoventilation syndrome as she is morbidly obese with a BMI of 48.  CTA chest was suboptimal but negative for central PE.  COVID-19 PCR was negative.  Probable small component of CHF as imaging did show some vascular congestion and cardiomegaly but she has no peripheral edema today on exam  (reportedly 2+ edema at time of admission).   ECHO 8/3: EF >55%, indeterminate diastolic parameters (limited study due to body habitus).  She wore CPAP last night and upon waking this morning had no confusion or hallucinations which she had been experiencing at home.  She tolerated it fairly well and reported waking up feeling much better. --ABG needed to qualify for home CPAP.  Hold CPAP overnight and get ABG early tomorrow AM. --No diuresis as she appears euvolemic. --TOC consulted for DME - CPAP --Supplemental O2 to maintain O2 sat greater than 90%, wean as tolerated  Acute metabolic encephalopathy -present on admission, resolved after CPAP overnight and was likely secondary to respiratory failure.  CT head in the ED was negative.  No evidence of infection.  Normal ammonia level.  Monitor.  AKI superimposed on CKD stage II -AKI resolved.  Presented with creatinine 1.32, BUN 39.  Baseline creatinine is around 1.0.  She did receive IV fluids in the ED.  Will defer additional fluids and monitor.  Follow BMP.  Avoid nephrotoxins and hypotension.  Resume chlorthalidone.  History of lower extremity DVT -continue Eliquis  Essential hypertension - Continue home atenolol.  Resume chlorthalidone. (held due to AKI now resolved).   Hydralazine PRN.  Maintain MAP>=65.  Hematuria - UA showed >50 rbc's and large hemoglobin, suspect due to Eliquis.  Hbg is stable and normal.  Continue Eliquis.  PCP follow up.  Will get urology to see if it continues.  Depression/Anxiety - continue home Paxil  Hx of Vertigo -  continue home meclizine  Morbid obesity - Body mass index is 48.31 kg/m.  Complicates overall care and prognosis.   DVT prophylaxis:  apixaban (ELIQUIS) tablet 5 mg   Diet:  Diet Orders (From admission, onward)    Start     Ordered   11/24/19 1323  Diet Heart Room service appropriate? Yes; Fluid consistency: Thin  Diet effective now       Question Answer Comment  Room service appropriate?  Yes   Fluid consistency: Thin      11/24/19 1322            Code Status: Full Code    Subjective 11/25/19    Patient seen at bedside this AM.  She wore CPAP overnight, tolerated but says it was a little aggravating.  Says she feels much better today.  She did not have any confusion or hallucinations upon waking this AM (as had been happening at home).   Denies fever/chills, chest pain, SOB, N/V/D or other complaints.   Disposition Plan & Communication   Status is: Inpatient  Remains inpatient appropriate because:Ongoing diagnostic testing needed not appropriate for outpatient work up.  Patient continues to require supplemental oxygen which is new for her as well.  She will require CPAP on discharge to prevent readmission to hospital, requiring at least 1 more night stay for AM ABG off of CPAP in order to qualify.   Dispo: The patient is from: Home              Anticipated d/c is to: Home              Anticipated d/c date is: 1 day              Patient currently is not medically stable to d/c.   Family Communication: none at bedside, will attempt to call    Consults, Procedures, Significant Events   Consultants:   None  Procedures:   None  Antimicrobials:   None    Objective   Vitals:   11/25/19 0411 11/25/19 0710 11/25/19 0741 11/25/19 1109  BP: (!) 134/58  (!) 141/97 (!) 127/48  Pulse: 63  69 66  Resp:      Temp: 98.8 F (37.1 C)  98 F (36.7 C) 99 F (37.2 C)  TempSrc: Oral  Oral Oral  SpO2: 92% 91% 93% 94%  Weight: (!) 144.1 kg     Height:        Intake/Output Summary (Last 24 hours) at 11/25/2019 1522 Last data filed at 11/25/2019 1330 Gross per 24 hour  Intake 480 ml  Output 0 ml  Net 480 ml   Filed Weights   11/24/19 0713 11/24/19 2022 11/25/19 0411  Weight: (!) 157 kg (!) 141.2 kg (!) 144.1 kg    Physical Exam:  General exam: awake, alert, no acute distress, morbidly obese Respiratory system: CTAB but diminished due to body habitus,  no wheezes or rhonchi, normal respiratory effort. Cardiovascular system: normal S1/S2, RRR, no edema.   Central nervous system: A&O x3. no gross focal neurologic deficits, normal speech Extremities: moves all, no cyanosis, normal tone Skin: dry, intact, normal temperature Psychiatry: normal mood, congruent affect  Labs   Data Reviewed: I have personally reviewed following labs and imaging studies  CBC: Recent Labs  Lab 11/24/19 0722 11/25/19 0436  WBC 11.4* 9.4  NEUTROABS 9.4*  --   HGB 14.6 13.8  HCT 46.3* 45.1  MCV 96.5 98.3  PLT 246 218   Basic Metabolic Panel:  Recent Labs  Lab 11/24/19 0722 11/25/19 0436  NA 138 139  K 3.7 3.3*  CL 97* 97*  CO2 29 32  GLUCOSE 157* 88  BUN 39* 31*  CREATININE 1.32* 0.88  CALCIUM 9.1 9.0  MG 2.1  --    GFR: Estimated Creatinine Clearance: 77.3 mL/min (by C-G formula based on SCr of 0.88 mg/dL). Liver Function Tests: Recent Labs  Lab 11/24/19 0722  AST 34  ALT 24  ALKPHOS 54  BILITOT 1.4*  PROT 8.1  ALBUMIN 3.8   No results for input(s): LIPASE, AMYLASE in the last 168 hours. Recent Labs  Lab 11/24/19 0722  AMMONIA 19   Coagulation Profile: Recent Labs  Lab 11/24/19 0813  INR 1.3*   Cardiac Enzymes: No results for input(s): CKTOTAL, CKMB, CKMBINDEX, TROPONINI in the last 168 hours. BNP (last 3 results) No results for input(s): PROBNP in the last 8760 hours. HbA1C: No results for input(s): HGBA1C in the last 72 hours. CBG: No results for input(s): GLUCAP in the last 168 hours. Lipid Profile: No results for input(s): CHOL, HDL, LDLCALC, TRIG, CHOLHDL, LDLDIRECT in the last 72 hours. Thyroid Function Tests: Recent Labs    11/24/19 0722  TSH 0.998   Anemia Panel: No results for input(s): VITAMINB12, FOLATE, FERRITIN, TIBC, IRON, RETICCTPCT in the last 72 hours. Sepsis Labs: No results for input(s): PROCALCITON, LATICACIDVEN in the last 168 hours.  Recent Results (from the past 240 hour(s))  SARS  Coronavirus 2 by RT PCR (hospital order, [facility]) Nasopharyngeal Nasopharyngeal Swab     Status: None   Collection Time: 11/24/19  7:22 AM   Specimen: Nasopharyngeal Swab  Result Value Ref Range Status   SARS Coronavirus 2 NEGATIVE NEGATIVE Final    Comment: (NOTE) SARS-CoV-2 target nucleic acids are NOT DETECTED.  The SARS-CoV-2 RNA is generally detectable in upper and lower respiratory specimens during the acute phase of infection. The lowest concentration of SARS-CoV-2 viral copies this assay can detect is 250 copies / mL. A negative result does not preclude SARS-CoV-2 infection and should not be used as the sole basis for treatment or other patient management decisions.  A negative result may occur with improper specimen collection / handling, submission of specimen other than nasopharyngeal swab, presence of viral mutation(s) within the areas targeted by this assay, and inadequate number of viral copies (<250 copies / mL). A negative result must be combined with clinical observations, patient history, and epidemiological information.  Fact Sheet for Patients:   BoilerBrush.com.cyhttps://www.fda.gov/media/136312/download  Fact Sheet for Healthcare Providers: https://Karnes.com/https://www.fda.gov/media/136313/download  This test is not yet approved or  cleared by the [place] FDA and has been authorized for detection and/or diagnosis of SARS-CoV-2 by FDA under an Emergency Use Authorization (EUA).  This EUA will remain in effect (meaning this test can be used) for the duration of the COVID-19 declaration under Section 564(b)(1) of the Act, 21 U.S.C. section 360bbb-3(b)(1), unless the authorization is terminated or revoked sooner.  Performed at [facility], [address] [place], [place] [address]       Imaging Studies   CT Head Wo Contrast  Result Date: 11/24/2019 CLINICAL DATA:  Mental status changes EXAM: CT HEAD WITHOUT CONTRAST TECHNIQUE: Contiguous axial  images were obtained from the base of the skull through the vertex without intravenous contrast. COMPARISON:  None. FINDINGS: Brain: No acute intracranial abnormality. Specifically, no hemorrhage, hydrocephalus, mass lesion, acute infarction, or significant intracranial injury. Vascular: No hyperdense vessel or unexpected calcification.  Skull: No acute calvarial abnormality. Sinuses/Orbits: Visualized paranasal sinuses and mastoids clear. Orbital soft tissues unremarkable. Other: None IMPRESSION: No acute intracranial abnormality. Electronically Signed   By: Charlett Nose M.D.   On: 11/24/2019 08:49   CT Angio Chest PE W and/or Wo Contrast  Result Date: 11/24/2019 CLINICAL DATA:  Hypoxia EXAM: CT ANGIOGRAPHY CHEST WITH CONTRAST TECHNIQUE: Multidetector CT imaging of the chest was performed using the standard protocol during bolus administration of intravenous contrast. Multiplanar CT image reconstructions and MIPs were obtained to evaluate the vascular anatomy. CONTRAST:  43mL OMNIPAQUE IOHEXOL 350 MG/ML SOLN COMPARISON:  None. FINDINGS: Cardiovascular: Suboptimal study due to respiratory motion. No large/central pulmonary emboli. Difficult to assess for peripheral emboli due to motion artifact. Heart is mildly enlarged. Coronary artery and scattered aortic calcifications. No aneurysm. Mediastinum/Nodes: No mediastinal, hilar, or axillary adenopathy. Trachea and esophagus are unremarkable. Thyroid unremarkable. Lungs/Pleura: Vascular congestion. No confluent opacity. Dependent/bibasilar atelectasis. No effusions. Upper Abdomen: Imaging into the upper abdomen shows no acute findings. Musculoskeletal: Chest wall soft tissues are unremarkable. No acute bony abnormality. Review of the MIP images confirms the above findings. IMPRESSION: Suboptimal study due to patient's/respiratory motion. No large or central pulmonary emboli seen. Cardiomegaly, vascular congestion. Coronary artery disease. Aortic Atherosclerosis  (ICD10-I70.0). Electronically Signed   By: Charlett Nose M.D.   On: 11/24/2019 08:48   ECHOCARDIOGRAM COMPLETE  Result Date: 11/25/2019    ECHOCARDIOGRAM REPORT   Patient Name:   TALAYA LAMPRECHT Bomberger Date of Exam: 11/25/2019 Medical Rec #:  [id number]     Height:       68.0 in Accession #:    [id number]    Weight:       317.7 lb Date of Birth:  01-13-1941      BSA:          2.488 m Patient Age:    79 years      BP:           141/97 mmHg Patient Gender: F             HR:           68 bpm. Exam Location:  [facility] Procedure: 2D Echo, Cardiac Doppler and Limited Color Doppler Indications:     I50.31 CHF-Acute Diastolic  History:         Patient has no prior history of Echocardiogram examinations.                  Risk Factors:Hypertension.  Sonographer:     Humphrey Rolls RDCS (AE) Referring Phys:  Kern Reap Brien Few NIU Diagnosing Phys: Yvonne Kendall MD  Sonographer Comments: Technically challenging study due to limited acoustic windows, no apical window and no subcostal window. Image acquisition challenging due to patient body habitus. IMPRESSIONS  1. Left ventricular ejection fraction, by estimation, is >55%. The left ventricle has normal function. Left ventricular endocardial border not optimally defined to evaluate regional wall motion. There is mild left ventricular hypertrophy. Left ventricular diastolic parameters are indeterminate.  2. Right ventricular systolic function was not well visualized. The right ventricular size is not well visualized.  3. The mitral valve is grossly normal. Mild mitral valve regurgitation. No evidence of mitral stenosis.  4. The aortic valve is tricuspid. Aortic valve regurgitation is trivial. No aortic stenosis is present. FINDINGS  Left Ventricle: Left ventricular ejection fraction, by estimation, is >55%. The left ventricle has normal function. Left ventricular endocardial border not optimally defined to evaluate regional wall motion. The left ventricular internal  cavity size was  normal in size. There  is mild left ventricular hypertrophy. Left ventricular diastolic parameters are indeterminate. Right Ventricle: The right ventricular size is not well visualized. No increase in right ventricular wall thickness. Right ventricular systolic function was not well visualized. Left Atrium: Left atrial size was not well visualized. Right Atrium: Right atrial size was not well visualized. Pericardium: The pericardium was not well visualized. Mitral Valve: The mitral valve is grossly normal. Mild mitral valve regurgitation. No evidence of mitral valve stenosis. MV peak gradient, 3.0 mmHg. The mean mitral valve gradient is 1.0 mmHg. Tricuspid Valve: The tricuspid valve is grossly normal. Tricuspid valve regurgitation is trivial. Aortic Valve: The aortic valve is tricuspid. Aortic valve regurgitation is trivial. No aortic stenosis is present. Mild aortic valve annular calcification. Aortic valve mean gradient measures 5.0 mmHg. Aortic valve peak gradient measures 9.2 mmHg. Aortic  valve area, by VTI measures 3.30 cm. Pulmonic Valve: The pulmonic valve was not well visualized. Pulmonic valve regurgitation is trivial. No evidence of pulmonic stenosis. Aorta: The aortic root is normal in size and structure. Pulmonary Artery: The pulmonary artery is of normal size. Venous: The inferior vena cava was not well visualized. IAS/Shunts: The interatrial septum was not well visualized.  LEFT VENTRICLE PLAX 2D LVIDd:         5.00 cm  Diastology LVIDs:         3.01 cm  LV e' lateral:   8.81 cm/s LV PW:         1.24 cm  LV E/e' lateral: 8.8 LV IVS:        1.05 cm LVOT diam:     2.30 cm LV SV:         106 LV SV Index:   43 LVOT Area:     4.15 cm  LEFT ATRIUM         Index LA diam:    3.20 cm 1.29 cm/m  AORTIC VALVE                    PULMONIC VALVE AV Area (Vmax):    2.79 cm     PV Vmax:       1.65 m/s AV Area (Vmean):   2.99 cm     PV Vmean:      103.000 cm/s AV Area (VTI):     3.30 cm     PV VTI:        0.343 m AV Vmax:            152.00 cm/s  PV Peak grad:  10.9 mmHg AV Vmean:          109.000 cm/s PV Mean grad:  5.0 mmHg AV VTI:            0.322 m AV Peak Grad:      9.2 mmHg AV Mean Grad:      5.0 mmHg LVOT Vmax:         102.00 cm/s LVOT Vmean:        78.400 cm/s LVOT VTI:          0.256 m LVOT/AV VTI ratio: 0.80  AORTA Ao Root diam: 3.50 cm MITRAL VALVE MV Area (PHT): 2.69 cm    SHUNTS MV Peak grad:  3.0 mmHg    Systemic VTI:  0.26 m MV Mean grad:  1.0 mmHg    Systemic Diam: 2.30 cm MV Vmax:       0.86 m/s MV Vmean:  48.6 cm/s MV Decel Time: 282 msec MV E velocity: 77.10 cm/s MV A velocity: 76.70 cm/s MV E/A ratio:  1.01 Yvonne Kendall MD Electronically signed by Yvonne Kendall MD Signature Date/Time: 11/25/2019/11:56:51 AM    Final      Medications   Scheduled Meds: . apixaban  5 mg Oral BID  . atenolol  50 mg Oral Daily  . cholecalciferol  1,000 Units Oral Daily  . ipratropium-albuterol  3 mL Nebulization BID  . PARoxetine  30 mg Oral Daily  . cyanocobalamin  1,000 mcg Oral Daily   Continuous Infusions:     LOS: 0 days    Time spent: 30 minutes    Pennie Banter, DO [facility]  11/25/2019, 3:22 PM    If 7PM-7AM, please contact night-coverage. How to contact the [facility] Attending or Consulting provider 7A - 7P or covering provider during after hours 7P -7A, for this patient?    1. Check the care team in [facility] and look for a) attending/consulting [facility] provider listed and b) the [facility] team listed 2. Log into [email or web address] and use 's universal password to access. If you do not have the password, please contact the hospital operator. 3. Locate the [facility] provider you are looking for under [facility] and page to a number that you can be directly reached. 4. If you still have difficulty reaching the provider, please page the [facility] (Director on Call) for the Hospitalists listed on amion for assistance.

## 2019-11-26 DIAGNOSIS — N189 Chronic kidney disease, unspecified: Secondary | ICD-10-CM

## 2019-11-26 DIAGNOSIS — I5032 Chronic diastolic (congestive) heart failure: Secondary | ICD-10-CM

## 2019-11-26 DIAGNOSIS — I5031 Acute diastolic (congestive) heart failure: Secondary | ICD-10-CM

## 2019-11-26 DIAGNOSIS — J9602 Acute respiratory failure with hypercapnia: Secondary | ICD-10-CM

## 2019-11-26 DIAGNOSIS — R531 Weakness: Secondary | ICD-10-CM

## 2019-11-26 LAB — BLOOD GAS, ARTERIAL
Acid-Base Excess: 9.2 mmol/L — ABNORMAL HIGH (ref 0.0–2.0)
Bicarbonate: 36.3 mmol/L — ABNORMAL HIGH (ref 20.0–28.0)
FIO2: 0.36
O2 Saturation: 95.9 %
Patient temperature: 37
pCO2 arterial: 60 mmHg — ABNORMAL HIGH (ref 32.0–48.0)
pH, Arterial: 7.39 (ref 7.350–7.450)
pO2, Arterial: 82 mmHg — ABNORMAL LOW (ref 83.0–108.0)

## 2019-11-26 LAB — BASIC METABOLIC PANEL
Anion gap: 8 (ref 5–15)
BUN: 22 mg/dL (ref 8–23)
CO2: 33 mmol/L — ABNORMAL HIGH (ref 22–32)
Calcium: 8.5 mg/dL — ABNORMAL LOW (ref 8.9–10.3)
Chloride: 99 mmol/L (ref 98–111)
Creatinine, Ser: 1.06 mg/dL — ABNORMAL HIGH (ref 0.44–1.00)
GFR calc Af Amer: 57 mL/min — ABNORMAL LOW (ref >60)
GFR calc non Af Amer: 50 mL/min — ABNORMAL LOW (ref >60)
Glucose, Bld: 115 mg/dL — ABNORMAL HIGH (ref 70–99)
Potassium: 3.7 mmol/L (ref 3.5–5.1)
Sodium: 140 mmol/L (ref 135–145)

## 2019-11-26 MED ORDER — FUROSEMIDE 10 MG/ML IJ SOLN
40.0000 mg | Freq: Every day | INTRAMUSCULAR | Status: DC
  Administered 2019-11-26 – 2019-11-28 (×3): 40 mg via INTRAVENOUS
  Filled 2019-11-26 (×3): qty 4

## 2019-11-26 NOTE — Clinical Social Work Note (Cosign Needed)
Patient continues to exhibit signs of hypercapnia associated with morbid obesity that is causing thoracic restriction.  Interruption or failure to provide NIV would quickly lead to exacerbation of the patient's condition, hospital admission, and likely harm to the patient. Continued use is preferred.  The use of the NIV will treat patient's high PC02 levels and can reduce risk of exacerbations and future hospitalizations when used at night and during the day.  BiLevel/RAD has been considered and ruled out as patient requires continuous alarms, backup battery, and portability which are not possible with BiLevel/RAD devices.  Ventilation is required to decrease the work of breathing and improve pulmonary status. Interruption of ventilator support would lead to decline of health status.  Patient is able to protect their airways and clear secretions on their own.  Kathleen Barton, LCSWA [phone]  

## 2019-11-26 NOTE — [place] FL2 (Signed)
[place] MEDICAID FL2 LEVEL OF CARE SCREENING TOOL     IDENTIFICATION  Patient Name: Kathleen Barton Birthdate: 1941/03/05 Sex: female Admission Date (Current Location): 11/24/2019  [place] and [place] Number:  Chiropodist and Address:  [facility], [address], [place], [place] [address]      Provider Number: [id number]  Attending Physician Name and Address:  Alford Highland, MD  Relative Name and Phone Number:       Current Level of Care: Hospital Recommended Level of Care: Skilled Nursing Facility Prior Approval Number:    Date Approved/Denied:   PASRR Number: [id number]  Discharge Plan: SNF    Current Diagnoses: Patient Active Problem List   Diagnosis Date Noted  . Acute metabolic encephalopathy 11/24/2019  . Acute respiratory failure with hypoxia (HCC) 11/24/2019  . Acute renal failure superimposed on stage 2 chronic kidney disease (HCC) 11/24/2019  . Hematuria   . Prediabetes 11/22/2018  . Leukocytosis 07/26/2018  . Anxiety and depression 07/26/2018  . History of vertigo 07/26/2018  . Urge incontinence of urine 06/08/2017  . Abnormal mammogram 06/08/2017  . Morbid obesity with BMI of 50.0-59.9, adult (HCC) 02/18/2016  . B12 deficiency 10/14/2015  . History of DVT of lower extremity 08/27/2015  . Weakness of both lower extremities 08/27/2015  . Essential hypertension 08/27/2015    Orientation RESPIRATION BLADDER Height & Weight     Self, Place, Situation  O2 (NC4L) External catheter, Incontinent (placed 8/2) Weight: (!) 317 lb 12.8 oz (144.2 kg) Height:  5\' 8"  (172.7 cm)  BEHAVIORAL SYMPTOMS/MOOD NEUROLOGICAL BOWEL NUTRITION STATUS      Incontinent Diet (heart healthy, thin liquids)  AMBULATORY STATUS COMMUNICATION OF NEEDS Skin   Extensive Assist Verbally Normal                       Personal Care Assistance Level of Assistance  Bathing, Feeding, Dressing Bathing Assistance: Maximum assistance Feeding  assistance: Independent Dressing Assistance: Maximum assistance     Functional Limitations Info  Sight, Hearing, Speech Sight Info: Adequate Hearing Info: Adequate Speech Info: Adequate    SPECIAL CARE FACTORS FREQUENCY  PT (By licensed PT), OT (By licensed OT)     PT Frequency: 5x OT Frequency: 5x            Contractures Contractures Info: Not present    Additional Factors Info  Code Status, Allergies Code Status Info: Full Code Allergies Info: No known allergies           Current Medications (11/26/2019):  This is the current hospital active medication list Current Facility-Administered Medications  Medication Dose Route Frequency Provider Last Rate Last Admin  . acetaminophen (TYLENOL) tablet 650 mg  650 mg Oral Q6H PRN 01/26/2020, MD      . albuterol (PROVENTIL) (2.5 MG/3ML) 0.083% nebulizer solution 2.5 mg  2.5 mg Nebulization Q4H PRN Lorretta Harp, MD      . apixaban Lorretta Harp) tablet 5 mg  5 mg Oral BID Everlene Balls, MD   5 mg at 11/26/19 01/26/20  . atenolol (TENORMIN) tablet 50 mg  50 mg Oral Daily 6283, MD   50 mg at 11/26/19 01/26/20  . cholecalciferol (VITAMIN D3) tablet 1,000 Units  1,000 Units Oral Daily 1517, MD   1,000 Units at 11/26/19 [phone]  . dextromethorphan-guaiFENesin (MUCINEX DM) 30-600 MG per 12 hr tablet 1 tablet  1 tablet Oral BID PRN 6160, MD      . furosemide (LASIX)  injection 40 mg  40 mg Intravenous Daily Wieting, Richard, MD      . hydrALAZINE (APRESOLINE) injection 5 mg  5 mg Intravenous Q2H PRN Lorretta Harp, MD      . ipratropium-albuterol (DUONEB) 0.5-2.5 (3) MG/3ML nebulizer solution 3 mL  3 mL Nebulization BID Lorretta Harp, MD   3 mL at 11/26/19 0851  . meclizine (ANTIVERT) tablet 25 mg  25 mg Oral TID PRN Lorretta Harp, MD      . ondansetron [facility]) injection 4 mg  4 mg Intravenous Q8H PRN Lorretta Harp, MD      . PARoxetine (PAXIL) tablet 30 mg  30 mg Oral Daily Lorretta Harp, MD   30 mg at 11/26/19 0806  . sodium chloride flush (NS) 0.9 %  injection 3 mL  3 mL Intravenous Q12H Esaw Grandchild A, DO   3 mL at 11/26/19 0807  . vitamin B-12 (CYANOCOBALAMIN) tablet 1,000 mcg  1,000 mcg Oral Daily Lorretta Harp, MD   1,000 mcg at 11/26/19 9675     Discharge Medications: Please see discharge summary for a list of discharge medications.  Relevant Imaging Results:  Relevant Lab Results:   Additional Information SSN: [id number]  Maree Krabbe, LCSW

## 2019-11-26 NOTE — Progress Notes (Signed)
SATURATION QUALIFICATIONS: (This note is used to comply with regulatory documentation for home oxygen)  Patient Saturations on Room Air at Rest =86%   

## 2019-11-26 NOTE — Progress Notes (Signed)
Patient ID: Kathleen Barton, female   DOB: Sep 19, 1940, 79 y.o.   MRN: [id number] [facility] PROGRESS NOTE  Kathleen Barton NTI:[id number] DOB: 17-Jul-1940 DOA: 11/24/2019 PCP: [facility]  HPI/Subjective: Patient states that she came into the hospital seeing things that were not there.  She noticed that there was a wild cat in the closet that there was no cat there.  She also noticed that there was a frog and it was just 2 pieces of paper.  The patient has not seen any thing out of the ordinary while here in the hospital.  Feels okay.  This morning on 4 L of oxygen.  Objective: Vitals:   11/26/19 1130 11/26/19 1143  BP:  (!) 129/55  Pulse:  66  Resp:    Temp:  98.3 F (36.8 C)  SpO2: (!) 89% 97%    Intake/Output Summary (Last 24 hours) at 11/26/2019 1603 Last data filed at 11/26/2019 1448 Gross per 24 hour  Intake 240 ml  Output 1200 ml  Net -960 ml   Filed Weights   11/24/19 2022 11/25/19 0411 11/26/19 0338  Weight: (!) 141.2 kg (!) 144.1 kg (!) 144.2 kg    ROS: Review of Systems  Constitutional: Positive for malaise/fatigue.  Respiratory: Negative for shortness of breath.   Cardiovascular: Negative for chest pain.  Gastrointestinal: Negative for abdominal pain, nausea and vomiting.  Musculoskeletal: Positive for joint pain.   Exam: Physical Exam HENT:     Nose: No mucosal edema.     Mouth/Throat:     Pharynx: No oropharyngeal exudate.  Eyes:     General: Lids are normal.     Conjunctiva/sclera: Conjunctivae normal.     Pupils: Pupils are equal, round, and reactive to light.  Cardiovascular:     Rate and Rhythm: Normal rate and regular rhythm.     Heart sounds: Normal heart sounds, S1 normal and S2 normal.  Pulmonary:     Breath sounds: Examination of the right-lower field reveals decreased breath sounds. Examination of the left-lower field reveals decreased breath sounds. Decreased breath sounds present. No wheezing, rhonchi or rales.  Abdominal:      Palpations: Abdomen is soft.     Tenderness: There is no abdominal tenderness.  Musculoskeletal:     Right ankle: Swelling present.     Left ankle: Swelling present.  Skin:    General: Skin is warm.     Findings: No rash.  Neurological:     Mental Status: She is alert.       Data Reviewed: Basic Metabolic Panel: Recent Labs  Lab 11/24/19 0722 11/25/19 0436 11/26/19 0623  NA 138 139 140  K 3.7 3.3* 3.7  CL 97* 97* 99  CO2 29 32 33*  GLUCOSE 157* 88 115*  BUN 39* 31* 22  CREATININE 1.32* 0.88 1.06*  CALCIUM 9.1 9.0 8.5*  MG 2.1  --   --    Liver Function Tests: Recent Labs  Lab 11/24/19 0722  AST 34  ALT 24  ALKPHOS 54  BILITOT 1.4*  PROT 8.1  ALBUMIN 3.8    Recent Labs  Lab 11/24/19 0722  AMMONIA 19   CBC: Recent Labs  Lab 11/24/19 0722 11/25/19 0436  WBC 11.4* 9.4  NEUTROABS 9.4*  --   HGB 14.6 13.8  HCT 46.3* 45.1  MCV 96.5 98.3  PLT 246 218   BNP (last 3 results) Recent Labs    11/24/19 0722  BNP 93.8     Recent Results (from  the past 240 hour(s))  SARS Coronavirus 2 by RT PCR (hospital order, performed in [facility] hospital lab) Nasopharyngeal Nasopharyngeal Swab     Status: None   Collection Time: 11/24/19  7:22 AM   Specimen: Nasopharyngeal Swab  Result Value Ref Range Status   SARS Coronavirus 2 NEGATIVE NEGATIVE Final    Comment: (NOTE) SARS-CoV-2 target nucleic acids are NOT DETECTED.  The SARS-CoV-2 RNA is generally detectable in upper and lower respiratory specimens during the acute phase of infection. The lowest concentration of SARS-CoV-2 viral copies this assay can detect is 250 copies / mL. A negative result does not preclude SARS-CoV-2 infection and should not be used as the sole basis for treatment or other patient management decisions.  A negative result may occur with improper specimen collection / handling, submission of specimen other than nasopharyngeal swab, presence of viral mutation(s) within the areas  targeted by this assay, and inadequate number of viral copies (<250 copies / mL). A negative result must be combined with clinical observations, patient history, and epidemiological information.  Fact Sheet for Patients:   BoilerBrush.com.cy  Fact Sheet for Healthcare Providers: https://Stantz.com/  This test is not yet approved or  cleared by the [place] FDA and has been authorized for detection and/or diagnosis of SARS-CoV-2 by FDA under an Emergency Use Authorization (EUA).  This EUA will remain in effect (meaning this test can be used) for the duration of the COVID-19 declaration under Section 564(b)(1) of the Act, 21 U.S.C. section 360bbb-3(b)(1), unless the authorization is terminated or revoked sooner.  Performed at [facility], [address] [place], [place] [address]      Studies: ECHOCARDIOGRAM COMPLETE  Result Date: 11/25/2019    ECHOCARDIOGRAM REPORT   Patient Name:   Kathleen Barton Mathisen Date of Exam: 11/25/2019 Medical Rec #:  [id number]     Height:       68.0 in Accession #:    [id number]    Weight:       317.7 lb Date of Birth:  1941-03-08      BSA:          2.488 m Patient Age:    79 years      BP:           141/97 mmHg Patient Gender: F             HR:           68 bpm. Exam Location:  [facility] Procedure: 2D Echo, Cardiac Doppler and Limited Color Doppler Indications:     I50.31 CHF-Acute Diastolic  History:         Patient has no prior history of Echocardiogram examinations.                  Risk Factors:Hypertension.  Sonographer:     Humphrey Rolls RDCS (AE) Referring Phys:  Kern Reap Brien Few NIU Diagnosing Phys: Yvonne Kendall MD  Sonographer Comments: Technically challenging study due to limited acoustic windows, no apical window and no subcostal window. Image acquisition challenging due to patient body habitus. IMPRESSIONS  1. Left ventricular ejection fraction, by estimation, is >55%. The left ventricle has normal function. Left  ventricular endocardial border not optimally defined to evaluate regional wall motion. There is mild left ventricular hypertrophy. Left ventricular diastolic parameters are indeterminate.  2. Right ventricular systolic function was not well visualized. The right ventricular size is not well visualized.  3. The mitral valve is grossly normal. Mild mitral valve regurgitation. No evidence  of mitral stenosis.  4. The aortic valve is tricuspid. Aortic valve regurgitation is trivial. No aortic stenosis is present. FINDINGS  Left Ventricle: Left ventricular ejection fraction, by estimation, is >55%. The left ventricle has normal function. Left ventricular endocardial border not optimally defined to evaluate regional wall motion. The left ventricular internal cavity size was  normal in size. There is mild left ventricular hypertrophy. Left ventricular diastolic parameters are indeterminate. Right Ventricle: The right ventricular size is not well visualized. No increase in right ventricular wall thickness. Right ventricular systolic function was not well visualized. Left Atrium: Left atrial size was not well visualized. Right Atrium: Right atrial size was not well visualized. Pericardium: The pericardium was not well visualized. Mitral Valve: The mitral valve is grossly normal. Mild mitral valve regurgitation. No evidence of mitral valve stenosis. MV peak gradient, 3.0 mmHg. The mean mitral valve gradient is 1.0 mmHg. Tricuspid Valve: The tricuspid valve is grossly normal. Tricuspid valve regurgitation is trivial. Aortic Valve: The aortic valve is tricuspid. Aortic valve regurgitation is trivial. No aortic stenosis is present. Mild aortic valve annular calcification. Aortic valve mean gradient measures 5.0 mmHg. Aortic valve peak gradient measures 9.2 mmHg. Aortic  valve area, by VTI measures 3.30 cm. Pulmonic Valve: The pulmonic valve was not well visualized. Pulmonic valve regurgitation is trivial. No evidence of pulmonic  stenosis. Aorta: The aortic root is normal in size and structure. Pulmonary Artery: The pulmonary artery is of normal size. Venous: The inferior vena cava was not well visualized. IAS/Shunts: The interatrial septum was not well visualized.  LEFT VENTRICLE PLAX 2D LVIDd:         5.00 cm  Diastology LVIDs:         3.01 cm  LV e' lateral:   8.81 cm/s LV PW:         1.24 cm  LV E/e' lateral: 8.8 LV IVS:        1.05 cm LVOT diam:     2.30 cm LV SV:         106 LV SV Index:   43 LVOT Area:     4.15 cm  LEFT ATRIUM         Index LA diam:    3.20 cm 1.29 cm/m  AORTIC VALVE                    PULMONIC VALVE AV Area (Vmax):    2.79 cm     PV Vmax:       1.65 m/s AV Area (Vmean):   2.99 cm     PV Vmean:      103.000 cm/s AV Area (VTI):     3.30 cm     PV VTI:        0.343 m AV Vmax:           152.00 cm/s  PV Peak grad:  10.9 mmHg AV Vmean:          109.000 cm/s PV Mean grad:  5.0 mmHg AV VTI:            0.322 m AV Peak Grad:      9.2 mmHg AV Mean Grad:      5.0 mmHg LVOT Vmax:         102.00 cm/s LVOT Vmean:        78.400 cm/s LVOT VTI:          0.256 m LVOT/AV VTI ratio: 0.80  AORTA Ao Root diam: 3.50 cm MITRAL  VALVE MV Area (PHT): 2.69 cm    SHUNTS MV Peak grad:  3.0 mmHg    Systemic VTI:  0.26 m MV Mean grad:  1.0 mmHg    Systemic Diam: 2.30 cm MV Vmax:       0.86 m/s MV Vmean:      48.6 cm/s MV Decel Time: 282 msec MV E velocity: 77.10 cm/s MV A velocity: 76.70 cm/s MV E/A ratio:  1.01 Cristal Deerhristopher End MD Electronically signed by Yvonne Kendallhristopher End MD Signature Date/Time: 11/25/2019/11:56:51 AM    Final     Scheduled Meds: . apixaban  5 mg Oral BID  . atenolol  50 mg Oral Daily  . cholecalciferol  1,000 Units Oral Daily  . furosemide  40 mg Intravenous Daily  . ipratropium-albuterol  3 mL Nebulization BID  . PARoxetine  30 mg Oral Daily  . sodium chloride flush  3 mL Intravenous Q12H  . cyanocobalamin  1,000 mcg Oral Daily   Continuous Infusions:  Assessment/Plan:  1. Acute hypoxic hypercarbic respiratory  failure.  Try to taper down oxygen as much as possible.  Patient qualifies for oxygen at home with pulse ox of 86% on room air.  The patient also qualifies for noninvasive ventilation with PCO2 of 60 on ABG. 2. Acute diastolic congestive heart failure with cardiomegaly and vascular congestion seen on CT scan.  Echocardiogram showed a normal EF.  We will give IV Lasix and see if she improves. 3. Morbid obesity with a BMI of 48.32 with thoracic restriction. 4. Acute metabolic encephalopathy with visual hallucinations at home.  This has improved.  Likely hypoxia driven. 5. Acute kidney injury superimposed on chronic kidney disease stage IIIa.  With diuresis. 6. Weakness.  Physical therapy recommended rehab.  Patient's daughter is interested in exploring rehab options. 7. History of DVT on Eliquis 8. Hematuria likely secondary to Eliquis.        Code Status:     Code Status Orders  (From admission, onward)         Start     Ordered   11/24/19 1323  Full code  Continuous        11/24/19 1322        Code Status History    Date Active Date Inactive Code Status Order ID Comments User Context   11/24/2019 1116 11/24/2019 1322 DNR [id number]  Lorretta HarpNiu, Xilin, MD ED   Advance Care Planning Activity     Family Communication: Spoke with daughter on the phone Disposition Plan: Status is: Inpatient  Dispo: The patient is from: Home              Anticipated d/c is to: Rehab              Anticipated d/c date is: Potential 11/27/2019 if able to obtain a rehab bed and insurance authorization              Patient currently not stable to go home but can potentially go out to rehab tomorrow.  Time spent: 28 minutes  Seriah Brotzman [employer]  [facility]

## 2019-11-26 NOTE — Evaluation (Signed)
Physical Therapy Evaluation Patient Details Name: Kathleen Barton MRN: [id number] DOB: 05-21-1940 Today's Date: 11/26/2019   History of Present Illness  Kathleen Barton is a 79 y.o. female with medical history significant of hypertension, depression, anxiety, DVT on Eliquis, vertigo, obesity, CKD-2, morbid obesity, urinary incontinence, who presents to hosiptal ED 11/24/2019 via [facility] with altered mental status. Patient was admitted for acutemetabolic encephalopathy, acute respiroatory failure with hypoxia, acute renal failure on stage 2 CKD, and hematuria. CT angiogram suboptimal but negative for central PE.    Clinical Impression  Patient drowsy at first but with time become alert and was able to provide a history. Pt with recent AMS and no family to determine baseline. Appeared confused or having difficulty with memory a few times. May not be reliable historian. Reported that prior to hospitalization she was mod I with bed mobility, transfers, and household ambulation with RW. She used RW and w/c with assistance for community ambulation and required help with ADLs and IADLs. Lives in a single story home (one step to living room), with steps to enter and no handrail. Bathroom is being remodeled to accommodate handicapped needs better. Lives with son and daughter in law. Upon evaluation pt required max A +2 for bed mobility and sit <> stand transfer at edge of elevated bed and was able to stand with heavy UE support and PT assist to maintain balance for ~ 30 seconds. Pt appears to have experienced a decline in functional independence and mobility and would benefit from short term rehab prior to safe return home. Patient would benefit from skilled physical therapy to address impairments and functional limitations (see PT Problem List below) to work towards stated goals and return to PLOF or maximal functional independence.       Follow Up Recommendations SNF    Equipment Recommendations  3in1 (PT);Hospital  bed (hoyer lift)    Recommendations for Other Services       Precautions / Restrictions Precautions Precautions: None Restrictions Weight Bearing Restrictions: No      Mobility  Bed Mobility Overal bed mobility: Needs Assistance Bed Mobility: Rolling;Sidelying to Sit;Sit to Supine Rolling: Max assist;+2 for physical assistance Sidelying to sit: +2 for physical assistance;Max assist;HOB elevated   Sit to supine: +2 for physical assistance;Max assist;HOB elevated   General bed mobility comments: Patient required assistance at trunk and legs for bed mobility. Able to scoot herself with min A in trendelenburg position pulling on overhead railing and pushing with feet.  Transfers Overall transfer level: Needs assistance Equipment used: Rolling walker (2 wheeled) (bariatric) Transfers: Sit to/from Stand Sit to Stand: +2 physical assistance;Max assist;From elevated surface         General transfer comment: Patient completed one unsuccessful attempt and one successful attempt at sit <> stand at edge of bed (thigh high surface) with max A +2 to RW. Patient required step by step cuing for where to place hands and how to perform transfer (forward shift, push with UE, extend knees/hips). She failed to fully extend hips and knees remaining in a stooped posture dependent on RW. Able to stand for about 30 seconds.  Ambulation/Gait Ambulation/Gait assistance:  (unable)              Stairs            Wheelchair Mobility    Modified Rankin (Stroke Patients Only)       Balance Overall balance assessment: Needs assistance Sitting-balance support: Feet unsupported;Bilateral upper extremity supported Sitting balance-Leahy Scale: Poor  Sitting balance - Comments: intermittantly unsteady sitting at edge of bed with feet off the floor     Standing balance-Leahy Scale: Poor Standing balance comment: patient is dependent on BUE on RW and PT steadying assist to stand at edge of  bed for 30 seconds.                             Pertinent Vitals/Pain Pain Assessment: No/denies pain    Home Living Family/patient expects to be discharged to:: Private residence Living Arrangements: Children (son (works at home) & daughter in Social worker (works outside home)) Available Help at Discharge: Family;Available 24 hours/day   Home Access: Stairs to enter Entrance Stairs-Rails: None Entrance Stairs-Number of Steps: 3 steps at entrance (handrail has been removed), 5 steps at back entrance (pt reports no handrail) Home Layout: One level;Other (Comment);Able to live on main level with bedroom/bathroom (1 step between kitchen and living room) Home Equipment: Dan Humphreys - 2 wheels;Cane - single point;Grab bars - toilet;Shower seat;Wheelchair - Careers adviser (comment) (lift chair) Additional Comments: patient reports first that she sleeps in her bed, then later that she also sleeps in her lift chair    Prior Function Level of Independence: Needs assistance   Gait / Transfers Assistance Needed: pt reports she was able to complete bed mobility, transfer to/from lift chair, and ambulate in home with RW without assistance. States she uses RW or w/c in community with assistance.  ADL's / Homemaking Assistance Needed: patient takes sponge baths with assistance, needs assistance for dressing, needs assistance with ADLs        Hand Dominance        Extremity/Trunk Assessment   Upper Extremity Assessment Upper Extremity Assessment: Generalized weakness    Lower Extremity Assessment Lower Extremity Assessment: RLE deficits/detail;LLE deficits/detail RLE Deficits / Details: able to scoot LE laterally in bed, does not come to full extension in standing and dependent on heavy UE support on RW. Max A x 2 to come to standing from elevated bed. LLE Deficits / Details: able to scoot LE laterally in bed, does not come to full extension in standing and dependent on heavy UE support on RW.  Max A x 2 to come to standing from elevated bed.    Cervical / Trunk Assessment Cervical / Trunk Assessment: Other exceptions Cervical / Trunk Exceptions: morbidly obese  Communication   Communication: No difficulties  Cognition Arousal/Alertness: Awake/alert;Lethargic (was lethargic at first, alert after a few min of conversation) Behavior During Therapy: WFL for tasks assessed/performed Overall Cognitive Status: No family/caregiver present to determine baseline cognitive functioning                                 General Comments: patient is oriented to person, place. Has history of alterned mental status this hospitalisation and is unsure at times about history. May not be reliable historian.      General Comments      Exercises Other Exercises Other Exercises: educated pt on role of PT in acute care setting, dicharge reccomendations, use of RW and transfer techniques.   Assessment/Plan    PT Assessment Patient needs continued PT services  PT Problem List Decreased strength;Decreased coordination;Cardiopulmonary status limiting activity;Decreased range of motion;Decreased cognition;Decreased activity tolerance;Decreased knowledge of use of DME;Decreased balance;Decreased safety awareness;Obesity;Decreased mobility;Decreased knowledge of precautions       PT Treatment Interventions DME instruction;Balance training;Gait training;Neuromuscular re-education;Stair training;Cognitive  remediation;Functional mobility training;Therapeutic activities;Therapeutic exercise;Wheelchair mobility training;Patient/family education    PT Goals (Current goals can be found in the Care Plan section)  Acute Rehab PT Goals Patient Stated Goal: go home with HHPT PT Goal Formulation: With patient Time For Goal Achievement: 12/10/19 Potential to Achieve Goals: Fair    Frequency Min 2X/week   Barriers to discharge Decreased caregiver support;Inaccessible home environment patient  will need increased physical assist and has declined in ability to navigate household layout compared to prior level of function    Co-evaluation               AM-PAC PT "6 Clicks" Mobility  Outcome Measure Help needed turning from your back to your side while in a flat bed without using bedrails?: A Lot Help needed moving from lying on your back to sitting on the side of a flat bed without using bedrails?: A Lot Help needed moving to and from a bed to a chair (including a wheelchair)?: Total Help needed standing up from a chair using your arms (e.g., wheelchair or bedside chair)?: A Lot Help needed to walk in hospital room?: Total Help needed climbing 3-5 steps with a railing? : Total 6 Click Score: 9    End of Session Equipment Utilized During Treatment: Gait belt;Other (comment);Oxygen (4L/min O2, bariatric RW) Activity Tolerance: Patient tolerated treatment well;Patient limited by fatigue Patient left: in bed;with call bell/phone within reach;with bed alarm set Nurse Communication: Mobility status PT Visit Diagnosis: Muscle weakness (generalized) (M62.81);Difficulty in walking, not elsewhere classified (R26.2);Unsteadiness on feet (R26.81)    Time: 9702-6378 PT Time Calculation (min) (ACUTE ONLY): 27 min   Charges:   PT Evaluation $PT Eval Moderate Complexity: 1 Mod PT Treatments $Therapeutic Activity: 8-22 mins        Luretha Murphy. Ilsa Iha, PT, DPT 11/26/19, 10:40 AM

## 2019-11-26 NOTE — Evaluation (Signed)
Occupational Therapy Evaluation Patient Details Name: Kathleen Barton MRN: [id number] DOB: 07-13-1940 Today's Date: 11/26/2019    History of Present Illness Kathleen Barton is a 79 y.o. female with medical history significant of hypertension, depression, anxiety, DVT on Eliquis, vertigo, obesity, CKD-2, morbid obesity, urinary incontinence, who presents to hosiptal ED 11/24/2019 via [facility] with altered mental status. Patient was admitted for acutemetabolic encephalopathy, acute respiroatory failure with hypoxia, acute renal failure on stage 2 CKD, and hematuria. CT angiogram suboptimal but negative for central PE.   Clinical Impression   Pt was seen for OT evaluation this date. Prior to hospital admission, pt was able to transfer with 2WW and walk short household distances. She was able to perform all UB ADLs and required assist from her DIL for LB ADLs such as bathing and dressing. Pt lives with son and DIL in [facility] with 3 STE. Currently pt demonstrates impairments as described below (See OT problem list) which functionally limit her ability to perform ADL/self-care tasks. Pt currently requires setup to MIN A for most bed level UB ADLs and MOD A for UB dressing with HOB elevated d/t some limited UE ROM. Pt declines to get OOB with OT on assessment citing fatigue from sitting up with PT this AM.  Pt would benefit from skilled OT to address noted impairments and functional limitations (see below for any additional details) in order to maximize safety and independence while minimizing falls risk and caregiver burden. Upon hospital discharge, recommend STR to maximize pt safety and return to PLOF.     Follow Up Recommendations  SNF    Equipment Recommendations  Tub/shower seat (reports that she believes that there will be a built in bench and grab bar in her remodeled walk-in shower.)    Recommendations for Other Services       Precautions / Restrictions Precautions Precautions: Fall Restrictions Weight  Bearing Restrictions: No      Mobility Bed Mobility Overal bed mobility: Needs Assistance Bed Mobility: Rolling Rolling: Max assist;+2 for physical assistance     General bed mobility comments: Pt declines to sit EOB with OT this session despite encouragement  Transfers         General transfer comment: NT, pt declines, MAX A +2 per PT note    Balance  Sitting balance - Comments: NT     Standing balance comment: NT                           ADL either performed or assessed with clinical judgement   ADL Overall ADL's : Needs assistance/impaired Eating/Feeding: Set up;Bed level Eating/Feeding Details (indicate cue type and reason): with HOB elevated Grooming: Set up;Bed level Grooming Details (indicate cue type and reason): with HOB elevated         Upper Body Dressing : Moderate assistance;Bed level   Lower Body Dressing: Maximal assistance;Total assistance;Bed level Lower Body Dressing Details (indicate cue type and reason): pt declines to sit EOB with OT this session to address self care skills.   Toilet Transfer Details (indicate cue type and reason): NT                 Vision Baseline Vision/History: Wears glasses Patient Visual Report: No change from baseline Additional Comments: reports she needs new glasses     Perception     Praxis      Pertinent Vitals/Pain Pain Assessment: No/denies pain     Hand Dominance Right  Extremity/Trunk Assessment Upper Extremity Assessment Upper Extremity Assessment: Generalized weakness (Pt with decreased shld ROM (~1/2 arc), grip grossly ~4-/5 B'ly.)   Lower Extremity Assessment Lower Extremity Assessment: Defer to PT evaluation RLE Deficits / Details: able to scoot LE laterally in bed, does not come to full extension in standing and dependent on heavy UE support on RW. Max A x 2 to come to standing from elevated bed. LLE Deficits / Details: able to scoot LE laterally in bed, does not come to  full extension in standing and dependent on heavy UE support on RW. Max A x 2 to come to standing from elevated bed.   Cervical / Trunk Assessment Cervical / Trunk Assessment: Other exceptions Cervical / Trunk Exceptions: morbidly obese   Communication Communication Communication: No difficulties   Cognition Arousal/Alertness: Awake/alert;Lethargic Behavior During Therapy: WFL for tasks assessed/performed Overall Cognitive Status: No family/caregiver present to determine baseline cognitive functioning                                 General Comments: Pt oriented and mostly appropriate, however, sometimes with decreased short term recall-e.g. RN reports having already explained medications to pt, but pt forgot and would not take medication until explained again by RN.   General Comments       Exercises Other Exercises Other Exercises: OT facilitates education re: role of OT in acute setting including safety with self care. Pt with moderate reception of education Other Exercises: OT faciltiates education re: bed level UE and core body weight/unresisted therex that pt could complete outside of therapy time to increase strength and carryover. Pt with moderate reception.   Shoulder Instructions      Home Living Family/patient expects to be discharged to:: Private residence Living Arrangements: Children (son (works from home) and DIL) Available Help at Discharge: Family;Available 24 hours/day Type of Home: House Home Access: Stairs to enter [employer] of Steps: 3 steps at entrance (handrail has been removed), 5 steps at back entrance (pt reports no handrail) Entrance Stairs-Rails: None Home Layout: One level;Other (Comment);Able to live on main level with bedroom/bathroom     Bathroom Shower/Tub: Tub/shower unit;Other (comment)   Bathroom Toilet: Standard (in process of bathroom remodel to have handicap height commode)     Home Equipment: Walker - 2  wheels;Cane - single point;Grab bars - toilet;Shower seat;Wheelchair - Careers adviser (comment)   Additional Comments: patient reports first that she sleeps in her bed, then later that she also sleeps in her lift chair      Prior Functioning/Environment Level of Independence: Needs assistance  Gait / Transfers Assistance Needed: pt reports she was able to complete bed mobility, transfer to/from lift chair, and ambulate in home with RW without assistance. States she uses RW or w/c in community with assistance. ADL's / Homemaking Assistance Needed: patient takes sponge baths while seated with assistance for LB, needs assistance for LB dressing, needs assistance with IADLs. Communication / Swallowing Assistance Needed: no Comments: reports that she manages her own medication. Reports that her son or DIL or occasionally grandson help transport her to appointments.        OT Problem List: Decreased strength;Decreased range of motion;Decreased activity tolerance;Impaired balance (sitting and/or standing);Decreased knowledge of use of DME or AE;Obesity      OT Treatment/Interventions: Self-care/ADL training;Therapeutic exercise;Energy conservation;DME and/or AE instruction;Therapeutic activities;Balance training;Patient/family education    OT Goals(Current goals can be found in the care plan section)  Acute Rehab OT Goals Patient Stated Goal: go home OT Goal Formulation: With patient Time For Goal Achievement: 12/10/19 Potential to Achieve Goals: Good  OT Frequency: Min 2X/week   Barriers to D/C:            Co-evaluation              AM-PAC OT "6 Clicks" Daily Activity     Outcome Measure Help from another person eating meals?: None Help from another person taking care of personal grooming?: A Little Help from another person toileting, which includes using toliet, bedpan, or urinal?: A Lot Help from another person bathing (including washing, rinsing, drying)?: A Lot Help from  another person to put on and taking off regular upper body clothing?: A Little Help from another person to put on and taking off regular lower body clothing?: Total 6 Click Score: 15   End of Session Nurse Communication: Other (comment) (RN notified that spO2 on 3L while at rest was 88-90% and on RA, drops to 86%. Nasal cannula left on throughout session. RN aware.)  Activity Tolerance: Patient tolerated treatment well;Other (comment) (somewhat self limiting) Patient left: in bed;with call bell/phone within reach;with bed alarm set  OT Visit Diagnosis: Unsteadiness on feet (R26.81);Muscle weakness (generalized) (M62.81)                Time: 8938-1017 OT Time Calculation (min): 42 min Charges:  OT General Charges $OT Visit: 1 Visit OT Evaluation $OT Eval Moderate Complexity: 1 Mod OT Treatments $Self Care/Home Management : 23-37 mins  Rejeana Brock, MS, OTR/L ascom [phone] 11/26/19, 1:29 PM

## 2019-11-27 LAB — BASIC METABOLIC PANEL
Anion gap: 11 (ref 5–15)
BUN: 20 mg/dL (ref 8–23)
CO2: 33 mmol/L — ABNORMAL HIGH (ref 22–32)
Calcium: 8.7 mg/dL — ABNORMAL LOW (ref 8.9–10.3)
Chloride: 94 mmol/L — ABNORMAL LOW (ref 98–111)
Creatinine, Ser: 0.97 mg/dL (ref 0.44–1.00)
GFR calc Af Amer: >60 mL/min (ref >60)
GFR calc non Af Amer: 55 mL/min — ABNORMAL LOW (ref >60)
Glucose, Bld: 249 mg/dL — ABNORMAL HIGH (ref 70–99)
Potassium: 3.4 mmol/L — ABNORMAL LOW (ref 3.5–5.1)
Sodium: 138 mmol/L (ref 135–145)

## 2019-11-27 MED ORDER — POTASSIUM CHLORIDE CRYS ER 20 MEQ PO TBCR
40.0000 meq | EXTENDED_RELEASE_TABLET | Freq: Once | ORAL | Status: AC
  Administered 2019-11-27: 40 meq via ORAL
  Filled 2019-11-27: qty 2

## 2019-11-27 NOTE — TOC Initial Note (Signed)
Transition of Care [facility]) - Initial/Assessment Note    Patient Details  Name: Kathleen Barton MRN: [id number] Date of Birth: Aug 26, 1940  Transition of [facility]) CM/SW Contact:    Maree Krabbe, LCSW Phone Number: 11/27/2019, 3:32 PM  Clinical Narrative:    CSW spoke with pt's daughter in law via telephone. Pt's daughter in law is agreeable to SNF and would prefer pt go to SNF. Pt's daughter in law wants to talk to pt about it. Pt's daughter in law [facility]) [facility], [facility]) [facility], or [facility]) [facility]. Pt will need a Bipap at night at facility.  [facility] are unable to get a Bipap for the pt. [facility] however, CSW waiting on a call back from admissions to determine if they can get a Bipap.               Expected Discharge Plan: Skilled Nursing Facility Barriers to Discharge: Continued Medical Work up   Patient Goals and CMS Choice Patient states their goals for this hospitalization and ongoing recovery are:: for pt to get stronger   Choice offered to / list presented to : Adult Children  Expected Discharge Plan and Services Expected Discharge Plan: Skilled Nursing Facility In-house Referral: Clinical Social Work   Post Acute Care Choice: Skilled Nursing Facility Living arrangements for the past 2 months: Single Family Home                                      Prior Living Arrangements/Services Living arrangements for the past 2 months: Single Family Home Lives with:: Self Patient language and need for interpreter reviewed:: Yes Do you feel safe going back to the place where you live?: Yes      Need for Family Participation in Patient Care: Yes (Comment) Care giver support system in place?: Yes (comment)   Criminal Activity/Legal Involvement Pertinent to Current Situation/Hospitalization: No - Comment as needed  Activities of Daily Living Home Assistive Devices/Equipment: Walker (specify type) ADL Screening (condition at time of  admission) Patient's cognitive ability adequate to safely complete daily activities?: Yes Is the patient deaf or have difficulty hearing?: No Does the patient have difficulty seeing, even when wearing glasses/contacts?: Yes Does the patient have difficulty concentrating, remembering, or making decisions?: No Patient able to express need for assistance with ADLs?: Yes Does the patient have difficulty dressing or bathing?: Yes Independently performs ADLs?: No Communication: Independent Dressing (OT): Needs assistance Is this a change from baseline?: Pre-admission baseline Grooming: Needs assistance Is this a change from baseline?: Pre-admission baseline Feeding: Independent Bathing: Needs assistance Is this a change from baseline?: Pre-admission baseline Toileting: Needs assistance Is this a change from baseline?: Pre-admission baseline In/Out Bed: Independent with device (comment) (uses walker) Walks in Home: Independent with device (comment) (uses walker) Does the patient have difficulty walking or climbing stairs?: Yes Weakness of Legs: Both Weakness of Arms/Hands: None  Permission Sought/Granted Permission sought to share information with : Family Supports    Share Information with NAME: Kathleen Barton     Permission granted to share info w Relationship: daughter in law     Emotional Assessment Appearance:: Appears stated age Attitude/Demeanor/Rapport: Unable to Assess Affect (typically observed): Unable to Assess Orientation: : Oriented to Situation, Oriented to Place, Oriented to Self Alcohol / Substance Use: Not Applicable Psych Involvement: No (comment)  Admission diagnosis:  Uremia [N19] Acute respiratory failure with  hypoxia (HCC) [J96.01] AKI (acute kidney injury) (HCC) [N17.9] Altered mental status, unspecified altered mental status type [R41.82] Acute metabolic encephalopathy [G93.41] Hematuria, unspecified type [R31.9] Patient Active Problem List   Diagnosis Date Noted   . Acute diastolic congestive heart failure (HCC)   . Weakness   . Acute metabolic encephalopathy 11/24/2019  . Acute respiratory failure with hypoxia and hypercapnia (HCC) 11/24/2019  . Acute kidney injury superimposed on CKD (HCC) 11/24/2019  . Hematuria   . Prediabetes 11/22/2018  . Leukocytosis 07/26/2018  . Anxiety and depression 07/26/2018  . History of vertigo 07/26/2018  . Urge incontinence of urine 06/08/2017  . Abnormal mammogram 06/08/2017  . Obesity, Class III, BMI 40-49.9 (morbid obesity) (HCC) 02/18/2016  . B12 deficiency 10/14/2015  . History of DVT of lower extremity 08/27/2015  . Weakness of both lower extremities 08/27/2015  . Essential hypertension 08/27/2015   PCP:  [facility]:   [facility] - [place], [place] - [address] [place] [address] Phone: [phone] Fax: [phone]  [facility] - [place], [place] - [address] [place] [address] Phone: [phone] Fax: [phone]     Social Determinants of Health (SDOH) Interventions    Readmission Risk Interventions No flowsheet data found.

## 2019-11-27 NOTE — Progress Notes (Signed)
Patient ID: LAKYSHA KOSSMAN, female   DOB: 11/26/1940, 79 y.o.   MRN: [id number] [facility] PROGRESS NOTE  LOUKISHA GUNNERSON LZJ:[id number] DOB: 1940-09-21 DOA: 11/24/2019 PCP: [facility]  HPI/Subjective: Patient initially came into the hospital with visual hallucinations.  She was found to be hypoxic and was started on oxygen.  Patient urinating well.  Offers no complaints.  Today asking lots of questions about her diagnosis.  She told me that she is not interested in going to rehab.  Patient's daughter in law will try to speak to her about that.  Objective: Vitals:   11/27/19 0727 11/27/19 1202  BP: (!) 136/49 (!) 134/46  Pulse: 63 (!) 58  Resp:    Temp: 98.4 F (36.9 C) 97.6 F (36.4 C)  SpO2: 94% 90%    Intake/Output Summary (Last 24 hours) at 11/27/2019 1358 Last data filed at 11/27/2019 1202 Gross per 24 hour  Intake 240 ml  Output 4200 ml  Net -3960 ml   Filed Weights   11/25/19 0411 11/26/19 0338 11/27/19 0417  Weight: (!) 144.1 kg (!) 144.2 kg (!) 142.5 kg    ROS: Review of Systems  Constitutional: Positive for malaise/fatigue.  Respiratory: Negative for shortness of breath.   Cardiovascular: Negative for chest pain.  Gastrointestinal: Negative for abdominal pain, nausea and vomiting.   Exam: Physical Exam HENT:     Nose: No mucosal edema.     Mouth/Throat:     Pharynx: No oropharyngeal exudate.  Eyes:     General: Lids are normal.     Conjunctiva/sclera: Conjunctivae normal.     Pupils: Pupils are equal, round, and reactive to light.  Cardiovascular:     Rate and Rhythm: Normal rate and regular rhythm.     Heart sounds: Normal heart sounds, S1 normal and S2 normal.  Pulmonary:     Breath sounds: Examination of the right-lower field reveals decreased breath sounds. Examination of the left-lower field reveals decreased breath sounds. Decreased breath sounds present. No wheezing, rhonchi or rales.  Abdominal:     Palpations: Abdomen is soft.      Tenderness: There is no abdominal tenderness.  Musculoskeletal:     Right ankle: Swelling present.     Left ankle: Swelling present.  Skin:    General: Skin is warm.     Findings: No rash.  Neurological:     Mental Status: She is alert and oriented to person, place, and time.       Data Reviewed: Basic Metabolic Panel: Recent Labs  Lab 11/24/19 0722 11/25/19 0436 11/26/19 0623 11/27/19 0723  NA 138 139 140 138  K 3.7 3.3* 3.7 3.4*  CL 97* 97* 99 94*  CO2 29 32 33* 33*  GLUCOSE 157* 88 115* 249*  BUN 39* 31* 22 20  CREATININE 1.32* 0.88 1.06* 0.97  CALCIUM 9.1 9.0 8.5* 8.7*  MG 2.1  --   --   --    Liver Function Tests: Recent Labs  Lab 11/24/19 0722  AST 34  ALT 24  ALKPHOS 54  BILITOT 1.4*  PROT 8.1  ALBUMIN 3.8    Recent Labs  Lab 11/24/19 0722  AMMONIA 19   CBC: Recent Labs  Lab 11/24/19 0722 11/25/19 0436  WBC 11.4* 9.4  NEUTROABS 9.4*  --   HGB 14.6 13.8  HCT 46.3* 45.1  MCV 96.5 98.3  PLT 246 218   BNP (last 3 results) Recent Labs    11/24/19 0722  BNP 93.8  Recent Results (from the past 240 hour(s))  SARS Coronavirus 2 by RT PCR (hospital order, performed in [facility] hospital lab) Nasopharyngeal Nasopharyngeal Swab     Status: None   Collection Time: 11/24/19  7:22 AM   Specimen: Nasopharyngeal Swab  Result Value Ref Range Status   SARS Coronavirus 2 NEGATIVE NEGATIVE Final    Comment: (NOTE) SARS-CoV-2 target nucleic acids are NOT DETECTED.  The SARS-CoV-2 RNA is generally detectable in upper and lower respiratory specimens during the acute phase of infection. The lowest concentration of SARS-CoV-2 viral copies this assay can detect is 250 copies / mL. A negative result does not preclude SARS-CoV-2 infection and should not be used as the sole basis for treatment or other patient management decisions.  A negative result may occur with improper specimen collection / handling, submission of specimen other than  nasopharyngeal swab, presence of viral mutation(s) within the areas targeted by this assay, and inadequate number of viral copies (<250 copies / mL). A negative result must be combined with clinical observations, patient history, and epidemiological information.  Fact Sheet for Patients:   BoilerBrush.com.cy  Fact Sheet for Healthcare Providers: https://Meuser.com/  This test is not yet approved or  cleared by the [place] FDA and has been authorized for detection and/or diagnosis of SARS-CoV-2 by FDA under an Emergency Use Authorization (EUA).  This EUA will remain in effect (meaning this test can be used) for the duration of the COVID-19 declaration under Section 564(b)(1) of the Act, 21 U.S.C. section 360bbb-3(b)(1), unless the authorization is terminated or revoked sooner.  Performed at [facility], [address] [place], [place] [address]      Scheduled Meds: . apixaban  5 mg Oral BID  . atenolol  50 mg Oral Daily  . cholecalciferol  1,000 Units Oral Daily  . furosemide  40 mg Intravenous Daily  . ipratropium-albuterol  3 mL Nebulization BID  . PARoxetine  30 mg Oral Daily  . sodium chloride flush  3 mL Intravenous Q12H  . cyanocobalamin  1,000 mcg Oral Daily    Assessment/Plan:  1. Acute hypoxic hypercarbic respiratory failure.  I tapered oxygen down to 2 L.  Patient qualifies for oxygen at home with pulse ox of 86% on room air.  The patient also qualifies for noninvasive ventilation with PCO2 of 60 on ABG. 2. Acute diastolic congestive heart failure with cardiomegaly and vascular congestion seen on CT scan.  Echocardiogram showed a normal EF.  Continue IV Lasix daily while here.  Switch to oral at home. 3. Morbid obesity with a BMI of 47.76 with thoracic restriction. 4. Acute metabolic encephalopathy with visual hallucinations at home.  This has improved.  Likely hypoxia driven. 5. Acute kidney injury  superimposed on chronic kidney disease stage IIIa.  Patient's creatinine improved today to 0.97. 6. Weakness.  Physical therapy recommended rehab.  Patient's daughter is interested in exploring rehab options.  The patient today expressed that she did not want to go to rehab.  Family will speak to her today about going to rehab short-term. 7. History of DVT on Eliquis 8. Hematuria likely secondary to Eliquis.  Check CBC tomorrow morning  Code Status:     Code Status Orders  (From admission, onward)         Start     Ordered   11/24/19 1323  Full code  Continuous        11/24/19 1322        Code Status History  Date Active Date Inactive Code Status Order ID Comments User Context   11/24/2019 1116 11/24/2019 1322 DNR [id number]  Lorretta Harp, MD ED   Advance Care Planning Activity     Family Communication: Spoke with daughter on the phone Disposition Plan: Status is: Inpatient  Dispo: The patient is from: Home              Anticipated d/c is to: Rehab versus home with home health.              Anticipated d/c date is: Potential 11/28/2019.  Patient's family to speak with the patient about going to rehab if not will have to go home with home health.              Patient being treated for acute hypoxic hypercarbic respiratory failure and acute diastolic congestive heart failure  Time spent: 27 minutes  Joevanni Roddey [employer]

## 2019-11-27 NOTE — Plan of Care (Signed)
  Problem: Education: Goal: Knowledge of General Education information will improve Description Including pain rating scale, medication(s)/side effects and non-pharmacologic comfort measures Outcome: Progressing   

## 2019-11-28 LAB — CBC
HCT: 41 % (ref 36.0–46.0)
Hemoglobin: 13.3 g/dL (ref 12.0–15.0)
MCH: 30.6 pg (ref 26.0–34.0)
MCHC: 32.4 g/dL (ref 30.0–36.0)
MCV: 94.5 fL (ref 80.0–100.0)
Platelets: 201 10*3/uL (ref 150–400)
RBC: 4.34 MIL/uL (ref 3.87–5.11)
RDW: 14 % (ref 11.5–15.5)
WBC: 8.8 10*3/uL (ref 4.0–10.5)
nRBC: 0 % (ref 0.0–0.2)

## 2019-11-28 LAB — BASIC METABOLIC PANEL
Anion gap: 10 (ref 5–15)
BUN: 20 mg/dL (ref 8–23)
CO2: 33 mmol/L — ABNORMAL HIGH (ref 22–32)
Calcium: 8.5 mg/dL — ABNORMAL LOW (ref 8.9–10.3)
Chloride: 94 mmol/L — ABNORMAL LOW (ref 98–111)
Creatinine, Ser: 1.03 mg/dL — ABNORMAL HIGH (ref 0.44–1.00)
GFR calc Af Amer: 59 mL/min — ABNORMAL LOW (ref >60)
GFR calc non Af Amer: 51 mL/min — ABNORMAL LOW (ref >60)
Glucose, Bld: 156 mg/dL — ABNORMAL HIGH (ref 70–99)
Potassium: 3.6 mmol/L (ref 3.5–5.1)
Sodium: 137 mmol/L (ref 135–145)

## 2019-11-28 MED ORDER — NAPHAZOLINE-GLYCERIN 0.012-0.2 % OP SOLN
1.0000 [drp] | Freq: Four times a day (QID) | OPHTHALMIC | Status: DC | PRN
  Administered 2019-11-29 – 2019-11-30 (×3): 2 [drp] via OPHTHALMIC
  Filled 2019-11-28: qty 15

## 2019-11-28 NOTE — Care Management Important Message (Signed)
Important Message  Patient Details  Name: Kathleen Barton MRN: [id number] Date of Birth: July 10, 1940   Medicare Important Message Given:  Yes     Olegario Messier A Yaretsi Humphres 11/28/2019, 2:57 PM

## 2019-11-28 NOTE — Progress Notes (Signed)
PT Cancellation Note  Patient Details Name: Kathleen Barton MRN: [id number] DOB: Jul 09, 1940   Cancelled Treatment:     PT attempt. Pt politely refused requesting therapist return next date. " I have already been up walking one time and had three baths today. I'm just too tired." Acute PT will continue to follow per POC and progress as able be pt tolerance.    Rushie Chestnut 11/28/2019, 3:35 PM

## 2019-11-28 NOTE — Progress Notes (Signed)
Patient ID: Kathleen Barton, female   DOB: 08-11-1940, 79 y.o.   MRN: [id number] [facility] PROGRESS NOTE  Kathleen Barton [id number]:[id number] DOB: 10-20-1940 DOA: 11/24/2019 PCP: [facility]  HPI/Subjective: Patient initially came in with altered mental status.  She is doing better with regards to her mental status.  Still feels weak.  No pain.  Urinating well.  Objective: Vitals:   11/28/19 0718 11/28/19 1150  BP: 138/71 135/72  Pulse: 72 76  Resp: 17 18  Temp: 98.6 F (37 C) 98.1 F (36.7 C)  SpO2: 91% 91%    Intake/Output Summary (Last 24 hours) at 11/28/2019 1515 Last data filed at 11/28/2019 1300 Gross per 24 hour  Intake 1203 ml  Output 2200 ml  Net -997 ml   Filed Weights   11/26/19 0338 11/27/19 0417 11/28/19 0415  Weight: (!) 144.2 kg (!) 142.5 kg (!) 142 kg    ROS: Review of Systems  Constitutional: Positive for malaise/fatigue.  Respiratory: Negative for shortness of breath.   Cardiovascular: Negative for chest pain.  Gastrointestinal: Negative for abdominal pain.   Exam: Physical Exam HENT:     Nose: No mucosal edema.     Mouth/Throat:     Pharynx: No oropharyngeal exudate.  Eyes:     General: Lids are normal.     Extraocular Movements: Extraocular movements intact.     Pupils: Pupils are equal, round, and reactive to light.  Cardiovascular:     Rate and Rhythm: Normal rate and regular rhythm.     Heart sounds: Normal heart sounds, S1 normal and S2 normal.  Pulmonary:     Breath sounds: Examination of the right-lower field reveals decreased breath sounds. Examination of the left-lower field reveals decreased breath sounds. Decreased breath sounds present. No wheezing, rhonchi or rales.  Abdominal:     Palpations: Abdomen is soft.     Tenderness: There is no abdominal tenderness.  Musculoskeletal:     Right lower leg: Swelling present.     Left lower leg: Swelling present.  Skin:    General: Skin is warm.     Findings: No rash.   Neurological:     Mental Status: She is alert and oriented to person, place, and time.       Data Reviewed: Basic Metabolic Panel: Recent Labs  Lab 11/24/19 0722 11/25/19 0436 11/26/19 0623 11/27/19 0723 11/28/19 0503  NA 138 139 140 138 137  K 3.7 3.3* 3.7 3.4* 3.6  CL 97* 97* 99 94* 94*  CO2 29 32 33* 33* 33*  GLUCOSE 157* 88 115* 249* 156*  BUN 39* 31* 22 20 20   CREATININE 1.32* 0.88 1.06* 0.97 1.03*  CALCIUM 9.1 9.0 8.5* 8.7* 8.5*  MG 2.1  --   --   --   --    Liver Function Tests: Recent Labs  Lab 11/24/19 0722  AST 34  ALT 24  ALKPHOS 54  BILITOT 1.4*  PROT 8.1  ALBUMIN 3.8    Recent Labs  Lab 11/24/19 0722  AMMONIA 19   CBC: Recent Labs  Lab 11/24/19 0722 11/25/19 0436 11/28/19 0503  WBC 11.4* 9.4 8.8  NEUTROABS 9.4*  --   --   HGB 14.6 13.8 13.3  HCT 46.3* 45.1 41.0  MCV 96.5 98.3 94.5  PLT 246 218 201   BNP (last 3 results) Recent Labs    11/24/19 0722  BNP 93.8     Recent Results (from the past 240 hour(s))  SARS Coronavirus 2 by  RT PCR (hospital order, performed in [facility] hospital lab) Nasopharyngeal Nasopharyngeal Swab     Status: None   Collection Time: 11/24/19  7:22 AM   Specimen: Nasopharyngeal Swab  Result Value Ref Range Status   SARS Coronavirus 2 NEGATIVE NEGATIVE Final    Comment: (NOTE) SARS-CoV-2 target nucleic acids are NOT DETECTED.  The SARS-CoV-2 RNA is generally detectable in upper and lower respiratory specimens during the acute phase of infection. The lowest concentration of SARS-CoV-2 viral copies this assay can detect is 250 copies / mL. A negative result does not preclude SARS-CoV-2 infection and should not be used as the sole basis for treatment or other patient management decisions.  A negative result may occur with improper specimen collection / handling, submission of specimen other than nasopharyngeal swab, presence of viral mutation(s) within the areas targeted by this assay, and inadequate  number of viral copies (<250 copies / mL). A negative result must be combined with clinical observations, patient history, and epidemiological information.  Fact Sheet for Patients:   BoilerBrush.com.cy  Fact Sheet for Healthcare Providers: https://Mathes.com/  This test is not yet approved or  cleared by the [place] FDA and has been authorized for detection and/or diagnosis of SARS-CoV-2 by FDA under an Emergency Use Authorization (EUA).  This EUA will remain in effect (meaning this test can be used) for the duration of the COVID-19 declaration under Section 564(b)(1) of the Act, 21 U.S.C. section 360bbb-3(b)(1), unless the authorization is terminated or revoked sooner.  Performed at [facility], [address] [place], [place] [address]      Scheduled Meds: . apixaban  5 mg Oral BID  . atenolol  50 mg Oral Daily  . cholecalciferol  1,000 Units Oral Daily  . furosemide  40 mg Intravenous Daily  . ipratropium-albuterol  3 mL Nebulization BID  . PARoxetine  30 mg Oral Daily  . sodium chloride flush  3 mL Intravenous Q12H  . cyanocobalamin  1,000 mcg Oral Daily    Assessment/Plan:  1. Acute hypoxic hypercarbic respiratory failure.  Patient currently on 3 L of oxygen.  BiPAP at night.  Patient wore the BiPAP for 4 hours and then needed to have the straps loosened a little bit. qualifies for noninvasive ventilation with a PCO2 of 60 on ABG. 2. Acute diastolic congestive heart failure with cardiomegaly and vascular congestion seen on CT scan.  Normal ejection fraction on echocardiogram.  Continue IV Lasix daily and will switch over to oral upon getting out of the hospital. 3. Morbid obesity with a BMI of 47.60 with thoracic restriction. 4. Acute metabolic encephalopathy with visual hallucinations on presentation.  This has improved.  Mental status much improved.  Likely from hypoxia. 5. Acute kidney injury superimposed  on chronic kidney disease stage IIIa.  Today's creatinine 1.03 6. Weakness.  Physical therapy recommends rehab.  Patient now agreeable to rehab.  Awaiting insurance authorization 7. History of DVT on Eliquis 8. Hematuria secondary to Eliquis.  Hemoglobin stable at 13.3  Code Status:     Code Status Orders  (From admission, onward)         Start     Ordered   11/24/19 1323  Full code  Continuous        11/24/19 1322        Code Status History    Date Active Date Inactive Code Status Order ID Comments User Context   11/24/2019 1116 11/24/2019 1322 DNR [id number]  Lorretta Harp, MD ED  Advance Care Planning Activity     Family Communication: Left message for daughter on the phone Disposition Plan: Status is: Inpatient  Dispo: The patient is from: Home              Anticipated d/c is to: Rehab              Anticipated d/c date is: Potential 11/28/2019 if insurance authorization obtain today, if not potentially 12/01/2019.              Patient being treated for acute on chronic hypoxic hypercarbic respiratory failure with acute diastolic congestive heart failure.  Patient medically stable whenever insurance authorization obtained to go to rehab.  Time spent: 28 minutes  [employer]

## 2019-11-28 NOTE — Progress Notes (Signed)
Occupational Therapy Treatment Patient Details Name: Kathleen Barton MRN: [id number] DOB: 06-01-1940 Today's Date: 11/28/2019    History of present illness Kathleen Barton is a 79 y.o. female with medical history significant of hypertension, depression, anxiety, DVT on Eliquis, vertigo, obesity, CKD-2, morbid obesity, urinary incontinence, who presents to hosiptal ED 11/24/2019 via [facility] with altered mental status. Patient was admitted for acutemetabolic encephalopathy, acute respiroatory failure with hypoxia, acute renal failure on stage 2 CKD, and hematuria. CT angiogram suboptimal but negative for central PE.   OT comments  Pt seen for OT tx this date to f/u re: safety with ADLs/ADL transfers. Pt agreeable to OOB activity this date. During sup to sit, pt requires MOD A with HOB elevated. Pt requires MOD A with sit to stand with RW with EOB slightly elevated. Pt requires MOD A To sustain static stand and is noted to be somewhat soiled. Second person provides MAX A for peri care as pt requires B UEs on RW with OT's assistance to sustain static stand. Pt then performs 2-3 shuffling side steps to her R with OT facilitating verbal cues to attempt to increase foot clearance to reduce risk of falls. Pt safely to chair with one verbal cue to control descent on stand to sit to chair. OT engages pt in anterior peri care, bathing, drying and applying powder at this time with pt ultimately requiring MAX A despite verbal cues for thorough completion from OT. Pt left with LEs elevated and ed provided re: elevation to reduce edema. Pt with chair alarm, call bell and CNA present to take VS. OT educates CNA re: pt's transfer and mobility status. Overall, pt still requiring extensive assist with ADL transfers and self care. Continue to anticipate that SNF is most appropriate d/c recommendation at this time.   Follow Up Recommendations  SNF    Equipment Recommendations  Other (comment) (defer to next level of care.)     Recommendations for Other Services      Precautions / Restrictions Precautions Precautions: Fall Restrictions Weight Bearing Restrictions: No       Mobility Bed Mobility Overal bed mobility: Needs Assistance Bed Mobility: Supine to Sit     Supine to sit: Mod assist     General bed mobility comments: extended time, use of bed rails, HOB elevated, cues for scooting and use of draw sheet to bring hips square to EOB.  Transfers Overall transfer level: Needs assistance Equipment used: Rolling walker (2 wheeled) Transfers: Sit to/from Stand Sit to Stand: Mod assist;From elevated surface         General transfer comment: Cues for appropriate hand placement as pt attempts to pull to stand from 2WW requiring cues to push from bed with at least 1 UE to reduce risk of falls.    Balance Overall balance assessment: Needs assistance Sitting-balance support: Bilateral upper extremity supported;Feet unsupported Sitting balance-Leahy Scale: Fair     Standing balance support: Bilateral upper extremity supported Standing balance-Leahy Scale: Poor Standing balance comment: requires MOD A and B UE support to sustain static stand                           ADL either performed or assessed with clinical judgement   ADL Overall ADL's : Needs assistance/impaired             Lower Body Bathing: Maximal assistance;Sitting/lateral leans Lower Body Bathing Details (indicate cue type and reason): pt able to minimally particiapte in  anterior LB bathing in seated position, however, limited ROM and abdominal circumference interfere with thorough completion, pt ultimately requires MAX A to thoroughly complete. Upper Body Dressing : Minimal assistance;Sitting   Lower Body Dressing: Maximal assistance;Total assistance;Bed level Lower Body Dressing Details (indicate cue type and reason): to don socks     Toileting- Clothing Manipulation and Hygiene: Moderate assistance;Maximal  assistance;+2 for physical assistance;Sit to/from stand Toileting - Clothing Manipulation Details (indicate cue type and reason): Pt transferring to recliner, noted to be soiled. In standing requires MOD A with RW to sustain static stand while second person provides MAX A for peri care as pt is unable to perform posterior peri care in standing while using B UE on RW to sustain balance.             Vision Baseline Vision/History: Wears glasses Patient Visual Report: No change from baseline     Perception     Praxis      Cognition Arousal/Alertness: Awake/alert;Lethargic Behavior During Therapy: WFL for tasks assessed/performed Overall Cognitive Status: No family/caregiver present to determine baseline cognitive functioning                                 General Comments: Pt mostly appropriate, oriented, some questionable short term memory, but follows commands appropriately.        Exercises Other Exercises Other Exercises: OT facilitates ed with pt re: importance of OOB activity, use of call bell prior to getting too fatigued to assist with t/f back to bed to ask nursing staff for assist. Pt with moderate understanding.   Shoulder Instructions       General Comments      Pertinent Vitals/ Pain       Pain Assessment: No/denies pain  Home Living                                          Prior Functioning/Environment              Frequency  Min 2X/week        Progress Toward Goals  OT Goals(current goals can now be found in the care plan section)  Progress towards OT goals: Progressing toward goals  Acute Rehab OT Goals Patient Stated Goal: go home OT Goal Formulation: With patient Time For Goal Achievement: 12/10/19 Potential to Achieve Goals: Good  Plan Discharge plan remains appropriate    Co-evaluation                 AM-PAC OT "6 Clicks" Daily Activity     Outcome Measure   Help from another person  eating meals?: None Help from another person taking care of personal grooming?: A Little Help from another person toileting, which includes using toliet, bedpan, or urinal?: A Lot Help from another person bathing (including washing, rinsing, drying)?: A Lot Help from another person to put on and taking off regular upper body clothing?: A Little Help from another person to put on and taking off regular lower body clothing?: Total 6 Click Score: 15    End of Session Equipment Utilized During Treatment: Gait belt;Rolling walker  OT Visit Diagnosis: Unsteadiness on feet (R26.81);Muscle weakness (generalized) (M62.81)   Activity Tolerance Patient tolerated treatment well   Patient Left with call bell/phone within reach;in chair;with chair alarm set;with nursing/sitter in room (CNA in  room to take VS)   Nurse Communication Mobility status        Time: 8115-7262 OT Time Calculation (min): 23 min  Charges: OT General Charges $OT Visit: 1 Visit OT Treatments $Self Care/Home Management : 8-22 mins $Therapeutic Activity: 8-22 mins    Rejeana Brock, MS, OTR/L ascom [phone] 11/28/19, 4:01 PM

## 2019-11-28 NOTE — Plan of Care (Signed)
  Problem: Education: Goal: Knowledge of General Education information will improve Description Including pain rating scale, medication(s)/side effects and non-pharmacologic comfort measures Outcome: Progressing   

## 2019-11-29 LAB — BASIC METABOLIC PANEL
Anion gap: 12 (ref 5–15)
BUN: 23 mg/dL (ref 8–23)
CO2: 34 mmol/L — ABNORMAL HIGH (ref 22–32)
Calcium: 8.8 mg/dL — ABNORMAL LOW (ref 8.9–10.3)
Chloride: 90 mmol/L — ABNORMAL LOW (ref 98–111)
Creatinine, Ser: 1.14 mg/dL — ABNORMAL HIGH (ref 0.44–1.00)
GFR calc Af Amer: 53 mL/min — ABNORMAL LOW (ref >60)
GFR calc non Af Amer: 45 mL/min — ABNORMAL LOW (ref >60)
Glucose, Bld: 150 mg/dL — ABNORMAL HIGH (ref 70–99)
Potassium: 3.5 mmol/L (ref 3.5–5.1)
Sodium: 136 mmol/L (ref 135–145)

## 2019-11-29 MED ORDER — POTASSIUM CHLORIDE CRYS ER 20 MEQ PO TBCR
40.0000 meq | EXTENDED_RELEASE_TABLET | Freq: Once | ORAL | Status: AC
  Administered 2019-11-29: 40 meq via ORAL
  Filled 2019-11-29: qty 2

## 2019-11-29 MED ORDER — POTASSIUM CHLORIDE CRYS ER 20 MEQ PO TBCR
20.0000 meq | EXTENDED_RELEASE_TABLET | Freq: Every day | ORAL | Status: DC
  Administered 2019-11-30 – 2019-12-02 (×3): 20 meq via ORAL
  Filled 2019-11-29 (×3): qty 1

## 2019-11-29 MED ORDER — METOPROLOL SUCCINATE ER 50 MG PO TB24
50.0000 mg | ORAL_TABLET | Freq: Every day | ORAL | Status: DC
  Administered 2019-11-29 – 2019-12-02 (×4): 50 mg via ORAL
  Filled 2019-11-29 (×4): qty 1

## 2019-11-29 MED ORDER — FUROSEMIDE 40 MG PO TABS
40.0000 mg | ORAL_TABLET | Freq: Every day | ORAL | Status: DC
  Administered 2019-11-29 – 2019-12-02 (×4): 40 mg via ORAL
  Filled 2019-11-29 (×4): qty 1

## 2019-11-29 NOTE — TOC Progression Note (Addendum)
Transition of Care [facility]) - Progression Note    Patient Details  Name: Kathleen Barton MRN: [id number] Date of Birth: [birth year]  Transition of Care [facility]) CM/SW Contact  Ashley Royalty Lutricia Feil, RN Phone [phone] 11/29/2019, 11:02 AM  Clinical Narrative:    RN attempted outreach call to [facility] concerning update on accommodating pt for a Bipap for placement at this facility. Will update team accordingly with call back. Called 1100 to the main number [phone] and spoke with a representative who will relay the message to the staff nurse.  Addendum: Received a call back from [facility] floor nurse supervisor who was not able to provide an update however transfer to the admission coordinator who will not be available until Monday. Confidential voice mail left message concerning a request for information concerning update accommodating the Bipap prior to transfer to the facility.   TOC will follow up on Monday as instructed.  Expected Discharge Plan: Skilled Nursing Facility Barriers to Discharge: Continued Medical Work up  Expected Discharge Plan and Services Expected Discharge Plan: Skilled Nursing Facility In-house Referral: Clinical Social Work   Post Acute Care Choice: Skilled Nursing Facility Living arrangements for the past 2 months: Single Family Home                                       Social Determinants of Health (SDOH) Interventions    Readmission Risk Interventions No flowsheet data found.

## 2019-11-29 NOTE — Progress Notes (Signed)
Patient ID: Kathleen Barton, female   DOB: 1941/03/28, 79 y.o.   MRN: [id number] [facility] PROGRESS NOTE  MARLEE ARMENTEROS [id number]:[id number] DOB: 1940-07-01 DOA: 11/24/2019 PCP: [facility]  HPI/Subjective: Patient continues to feel little bit better every day.  She stated that she stood up and took a few steps to the chair yesterday.  Was admitted with acute mental status changes secondary to hypoxia  Objective: Vitals:   11/29/19 1102 11/29/19 1531  BP: (!) 135/56 (!) 115/50  Pulse: 71 70  Resp: 18 17  Temp: 98.3 F (36.8 C) 98.8 F (37.1 C)  SpO2: 91% 95%    Intake/Output Summary (Last 24 hours) at 11/29/2019 1602 Last data filed at 11/29/2019 1530 Gross per 24 hour  Intake 363 ml  Output 1300 ml  Net -937 ml   Filed Weights   11/27/19 0417 11/28/19 0415 11/29/19 0509  Weight: (!) 142.5 kg (!) 142 kg (!) 142.2 kg    ROS: Review of Systems  Constitutional: Positive for malaise/fatigue.  Respiratory: Negative for shortness of breath.   Cardiovascular: Negative for chest pain.  Gastrointestinal: Negative for abdominal pain.   Exam: Physical Exam HENT:     Nose: No mucosal edema.     Mouth/Throat:     Pharynx: No oropharyngeal exudate.  Eyes:     General: Lids are normal.     Conjunctiva/sclera: Conjunctivae normal.     Pupils: Pupils are equal, round, and reactive to light.  Cardiovascular:     Rate and Rhythm: Normal rate and regular rhythm.     Heart sounds: Normal heart sounds, S1 normal and S2 normal.  Pulmonary:     Breath sounds: Examination of the right-lower field reveals decreased breath sounds. Examination of the left-lower field reveals decreased breath sounds. Decreased breath sounds present. No wheezing, rhonchi or rales.  Abdominal:     Palpations: Abdomen is soft.     Tenderness: There is no abdominal tenderness.  Musculoskeletal:     Right lower leg: Swelling present.     Left lower leg: Swelling present.  Skin:    General: Skin is  warm.     Findings: No rash.  Neurological:     Mental Status: She is alert and oriented to person, place, and time.       Data Reviewed: Basic Metabolic Panel: Recent Labs  Lab 11/24/19 0722 11/24/19 8185 11/25/19 0436 11/26/19 6314 11/27/19 0723 11/28/19 0503 11/29/19 0458  NA 138   < > 139 140 138 137 136  K 3.7   < > 3.3* 3.7 3.4* 3.6 3.5  CL 97*   < > 97* 99 94* 94* 90*  CO2 29   < > 32 33* 33* 33* 34*  GLUCOSE 157*   < > 88 115* 249* 156* 150*  BUN 39*   < > 31* 22 20 20 23   CREATININE 1.32*   < > 0.88 1.06* 0.97 1.03* 1.14*  CALCIUM 9.1   < > 9.0 8.5* 8.7* 8.5* 8.8*  MG 2.1  --   --   --   --   --   --    < > = values in this interval not displayed.   Liver Function Tests: Recent Labs  Lab 11/24/19 0722  AST 34  ALT 24  ALKPHOS 54  BILITOT 1.4*  PROT 8.1  ALBUMIN 3.8    Recent Labs  Lab 11/24/19 0722  AMMONIA 19   CBC: Recent Labs  Lab 11/24/19 0722 11/25/19 0436  11/28/19 0503  WBC 11.4* 9.4 8.8  NEUTROABS 9.4*  --   --   HGB 14.6 13.8 13.3  HCT 46.3* 45.1 41.0  MCV 96.5 98.3 94.5  PLT 246 218 201   BNP (last 3 results) Recent Labs    11/24/19 0722  BNP 93.8     Recent Results (from the past 240 hour(s))  SARS Coronavirus 2 by RT PCR (hospital order, performed in [facility] hospital lab) Nasopharyngeal Nasopharyngeal Swab     Status: None   Collection Time: 11/24/19  7:22 AM   Specimen: Nasopharyngeal Swab  Result Value Ref Range Status   SARS Coronavirus 2 NEGATIVE NEGATIVE Final    Comment: (NOTE) SARS-CoV-2 target nucleic acids are NOT DETECTED.  The SARS-CoV-2 RNA is generally detectable in upper and lower respiratory specimens during the acute phase of infection. The lowest concentration of SARS-CoV-2 viral copies this assay can detect is 250 copies / mL. A negative result does not preclude SARS-CoV-2 infection and should not be used as the sole basis for treatment or other patient management decisions.  A negative result  may occur with improper specimen collection / handling, submission of specimen other than nasopharyngeal swab, presence of viral mutation(s) within the areas targeted by this assay, and inadequate number of viral copies (<250 copies / mL). A negative result must be combined with clinical observations, patient history, and epidemiological information.  Fact Sheet for Patients:   BoilerBrush.com.cy  Fact Sheet for Healthcare Providers: https://[place]iviera.com/  This test is not yet approved or  cleared by the [place] FDA and has been authorized for detection and/or diagnosis of SARS-CoV-2 by FDA under an Emergency Use Authorization (EUA).  This EUA will remain in effect (meaning this test can be used) for the duration of the COVID-19 declaration under Section 564(b)(1) of the Act, 21 U.S.C. section 360bbb-3(b)(1), unless the authorization is terminated or revoked sooner.  Performed at [facility], [address] [place], [place] [address]      Scheduled Meds: . apixaban  5 mg Oral BID  . cholecalciferol  1,000 Units Oral Daily  . furosemide  40 mg Oral Daily  . ipratropium-albuterol  3 mL Nebulization BID  . metoprolol succinate  50 mg Oral Daily  . PARoxetine  30 mg Oral Daily  . [START ON 11/30/2019] potassium chloride  20 mEq Oral Daily  . sodium chloride flush  3 mL Intravenous Q12H  . cyanocobalamin  1,000 mcg Oral Daily    Assessment/Plan:  1. Acute hypoxic hypercarbic respiratory failure.  Try to dial down to 2 L of oxygen.  BiPAP at night. 2. Acute diastolic congestive heart failure with cardiomegaly and vascular congestion on CT scan.  EF normal on echocardiogram.  IV Lasix switched over to oral today. 3. Morbid obesity with a BMI of 47.67. 4. Acute metabolic encephalopathy.  Patient had visual hallucinations prior to presentation.  This has improved.  Likely hypoxia driven. 5. Acute kidney injury superimposed  on chronic kidney disease stage IIIa.  With creatinine rising slightly a little bit today I changed IV Lasix over to oral. 6. Weakness.  Physical therapy recommends rehab.   7. History of DVT on Eliquis 8. Hematuria secondary to Eliquis.  Hemoglobin stable  Code Status:     Code Status Orders  (From admission, onward)         Start     Ordered   11/24/19 1323  Full code  Continuous        11/24/19  1322        Code Status History    Date Active Date Inactive Code Status Order ID Comments User Context   11/24/2019 1116 11/24/2019 1322 DNR [id number]  Lorretta Harp, MD ED   Advance Care Planning Activity     Family Communication: Spoke with daughter-in-law on the phone Disposition Plan: Status is: Inpatient  Dispo: The patient is from: Home              Anticipated d/c is to: Rehab              Anticipated d/c date is: To rehab on 12/01/2019              Patient medically stable to go to rehab.  We obtained insurance authorization.  Rehab facility trying to obtain a BiPAP.  Once BiPAP obtained the patient will be discharged out to rehab.  Time spent: 27 minutes  [employer] [place]

## 2019-11-29 NOTE — Progress Notes (Signed)
PT Cancellation Note  Patient Details Name: Kathleen Barton MRN: [id number] DOB: [birth year]   Cancelled Treatment:     PT attempt. Pt refused. She just finished getting a bath and reports " I'm too tired now." Unwilling even with encouragement. Pt requested PT return next date to continue progressing towards goals. Still awaiting Bi-pap at rehab for DC.  Rushie Chestnut 11/29/2019, 3:56 PM

## 2019-11-29 NOTE — Plan of Care (Signed)
  Problem: Education: Goal: Knowledge of General Education information will improve Description Including pain rating scale, medication(s)/side effects and non-pharmacologic comfort measures Outcome: Progressing   

## 2019-11-30 DIAGNOSIS — J9621 Acute and chronic respiratory failure with hypoxia: Secondary | ICD-10-CM

## 2019-11-30 DIAGNOSIS — J9610 Chronic respiratory failure, unspecified whether with hypoxia or hypercapnia: Secondary | ICD-10-CM

## 2019-11-30 DIAGNOSIS — J9622 Acute and chronic respiratory failure with hypercapnia: Secondary | ICD-10-CM

## 2019-11-30 LAB — BASIC METABOLIC PANEL
Anion gap: 12 (ref 5–15)
BUN: 28 mg/dL — ABNORMAL HIGH (ref 8–23)
CO2: 34 mmol/L — ABNORMAL HIGH (ref 22–32)
Calcium: 8.9 mg/dL (ref 8.9–10.3)
Chloride: 90 mmol/L — ABNORMAL LOW (ref 98–111)
Creatinine, Ser: 1.1 mg/dL — ABNORMAL HIGH (ref 0.44–1.00)
GFR calc Af Amer: 55 mL/min — ABNORMAL LOW (ref >60)
GFR calc non Af Amer: 47 mL/min — ABNORMAL LOW (ref >60)
Glucose, Bld: 138 mg/dL — ABNORMAL HIGH (ref 70–99)
Potassium: 3.6 mmol/L (ref 3.5–5.1)
Sodium: 136 mmol/L (ref 135–145)

## 2019-11-30 NOTE — Progress Notes (Signed)
Patient ID: Kathleen Barton, female   DOB: 1941-01-29, 79 y.o.   MRN: [id number] [facility] PROGRESS NOTE  Kathleen Barton:[id number] DOB: 1940/05/03 DOA: 11/24/2019 PCP: [facility]  HPI/Subjective: Patient feeling better than when she came in.  Was admitted initially with altered mental status.  She is urinating well.  She is trying to use the BiPAP as much as she can at night.  She is starting to get used to it.  Objective: Vitals:   11/30/19 0800 11/30/19 1140  BP: (!) 148/58 (!) 147/59  Pulse: 67 71  Resp: 19 18  Temp: 98.1 F (36.7 C) 98.4 F (36.9 C)  SpO2: 93% 93%    Intake/Output Summary (Last 24 hours) at 11/30/2019 1411 Last data filed at 11/30/2019 1142 Gross per 24 hour  Intake 483 ml  Output 1850 ml  Net -1367 ml   Filed Weights   11/28/19 0415 11/29/19 0509 11/30/19 0450  Weight: (!) 142 kg (!) 142.2 kg (!) 141.5 kg    ROS: Review of Systems  Respiratory: Negative for shortness of breath.   Cardiovascular: Negative for chest pain.  Gastrointestinal: Negative for abdominal pain.   Exam: Physical Exam HENT:     Mouth/Throat:     Pharynx: No oropharyngeal exudate.  Eyes:     General: Lids are normal.     Conjunctiva/sclera: Conjunctivae normal.     Pupils: Pupils are equal, round, and reactive to light.  Cardiovascular:     Rate and Rhythm: Normal rate and regular rhythm.     Heart sounds: Normal heart sounds, S1 normal and S2 normal.  Pulmonary:     Breath sounds: Examination of the right-lower field reveals decreased breath sounds. Examination of the left-lower field reveals decreased breath sounds. Decreased breath sounds present. No wheezing, rhonchi or rales.  Abdominal:     Palpations: Abdomen is soft.     Tenderness: There is no abdominal tenderness.  Musculoskeletal:     Right ankle: No swelling.     Left ankle: No swelling.  Skin:    General: Skin is warm.     Findings: No rash.  Neurological:     Mental Status: She is  alert and oriented to person, place, and time.       Data Reviewed: Basic Metabolic Panel: Recent Labs  Lab 11/24/19 0722 11/25/19 0436 11/26/19 6010 11/27/19 0723 11/28/19 0503 11/29/19 0458 11/30/19 0700  NA 138   < > 140 138 137 136 136  K 3.7   < > 3.7 3.4* 3.6 3.5 3.6  CL 97*   < > 99 94* 94* 90* 90*  CO2 29   < > 33* 33* 33* 34* 34*  GLUCOSE 157*   < > 115* 249* 156* 150* 138*  BUN 39*   < > 22 20 20 23  28*  CREATININE 1.32*   < > 1.06* 0.97 1.03* 1.14* 1.10*  CALCIUM 9.1   < > 8.5* 8.7* 8.5* 8.8* 8.9  MG 2.1  --   --   --   --   --   --    < > = values in this interval not displayed.   Liver Function Tests: Recent Labs  Lab 11/24/19 0722  AST 34  ALT 24  ALKPHOS 54  BILITOT 1.4*  PROT 8.1  ALBUMIN 3.8    Recent Labs  Lab 11/24/19 0722  AMMONIA 19   CBC: Recent Labs  Lab 11/24/19 0722 11/25/19 0436 11/28/19 0503  WBC 11.4* 9.4  8.8  NEUTROABS 9.4*  --   --   HGB 14.6 13.8 13.3  HCT 46.3* 45.1 41.0  MCV 96.5 98.3 94.5  PLT 246 218 201   BNP (last 3 results) Recent Labs    11/24/19 0722  BNP 93.8      Recent Results (from the past 240 hour(s))  SARS Coronavirus 2 by RT PCR (hospital order, performed in [facility] hospital lab) Nasopharyngeal Nasopharyngeal Swab     Status: None   Collection Time: 11/24/19  7:22 AM   Specimen: Nasopharyngeal Swab  Result Value Ref Range Status   SARS Coronavirus 2 NEGATIVE NEGATIVE Final    Comment: (NOTE) SARS-CoV-2 target nucleic acids are NOT DETECTED.  The SARS-CoV-2 RNA is generally detectable in upper and lower respiratory specimens during the acute phase of infection. The lowest concentration of SARS-CoV-2 viral copies this assay can detect is 250 copies / mL. A negative result does not preclude SARS-CoV-2 infection and should not be used as the sole basis for treatment or other patient management decisions.  A negative result may occur with improper specimen collection / handling, submission  of specimen other than nasopharyngeal swab, presence of viral mutation(s) within the areas targeted by this assay, and inadequate number of viral copies (<250 copies / mL). A negative result must be combined with clinical observations, patient history, and epidemiological information.  Fact Sheet for Patients:   BoilerBrush.com.cy  Fact Sheet for Healthcare Providers: https://Crumrine.com/  This test is not yet approved or  cleared by the [place] FDA and has been authorized for detection and/or diagnosis of SARS-CoV-2 by FDA under an Emergency Use Authorization (EUA).  This EUA will remain in effect (meaning this test can be used) for the duration of the COVID-19 declaration under Section 564(b)(1) of the Act, 21 U.S.C. section 360bbb-3(b)(1), unless the authorization is terminated or revoked sooner.  Performed at [facility], [address] [place], [place] [address]      Scheduled Meds: . apixaban  5 mg Oral BID  . cholecalciferol  1,000 Units Oral Daily  . furosemide  40 mg Oral Daily  . ipratropium-albuterol  3 mL Nebulization BID  . metoprolol succinate  50 mg Oral Daily  . PARoxetine  30 mg Oral Daily  . potassium chloride  20 mEq Oral Daily  . sodium chloride flush  3 mL Intravenous Q12H  . cyanocobalamin  1,000 mcg Oral Daily   Continuous Infusions:  Assessment/Plan:  1. 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  Continue to titrate oxygen down as much as possible.  Continue BiPAP at night. 2. Acute diastolic congestive heart failure with cardiomegaly.  EF normal on echocardiogram.  Continue oral Lasix. 3. Morbid obesity with a BMI of 47.44 4. Acute metabolic encephalopathy.  Patient had visual hallucinations prior to coming into the hospital.  This has improved.  This is likely secondary to hypoxia 5. Acute kidney injury on chronic kidney disease stage IIIa 6. Weakness.  Physical therapy  recommends rehab 7. History of DVT on Eliquis 8. Hematuria secondary to Eliquis.  Hemoglobin stable      Code Status:     Code Status Orders  (From admission, onward)         Start     Ordered   11/24/19 1323  Full code  Continuous        11/24/19 1322        Code Status History    Date Active Date Inactive Code Status Order ID  Comments User Context   11/24/2019 1116 11/24/2019 1322 DNR [id number]  Lorretta Harp, MD ED   Advance Care Planning Activity     Family Communication: Spoke with family yesterday Disposition Plan: Status is: Inpatient   Dispo: The patient is from: Home              Anticipated d/c is to: Rehab              Anticipated d/c date is: 12/01/2019              Patient currently medically stable to go to rehab.  Rehab facility trying to obtain a BiPAP.  Once BiPAP obtained can go up to the facility.  Time spent: 26 minutes  Toni [employer]

## 2019-12-01 DIAGNOSIS — F419 Anxiety disorder, unspecified: Secondary | ICD-10-CM

## 2019-12-01 DIAGNOSIS — F329 Major depressive disorder, single episode, unspecified: Secondary | ICD-10-CM

## 2019-12-01 LAB — BASIC METABOLIC PANEL
Anion gap: 9 (ref 5–15)
BUN: 28 mg/dL — ABNORMAL HIGH (ref 8–23)
CO2: 35 mmol/L — ABNORMAL HIGH (ref 22–32)
Calcium: 8.7 mg/dL — ABNORMAL LOW (ref 8.9–10.3)
Chloride: 91 mmol/L — ABNORMAL LOW (ref 98–111)
Creatinine, Ser: 1 mg/dL (ref 0.44–1.00)
GFR calc Af Amer: >60 mL/min (ref >60)
GFR calc non Af Amer: 53 mL/min — ABNORMAL LOW (ref >60)
Glucose, Bld: 128 mg/dL — ABNORMAL HIGH (ref 70–99)
Potassium: 3.7 mmol/L (ref 3.5–5.1)
Sodium: 135 mmol/L (ref 135–145)

## 2019-12-01 LAB — SARS CORONAVIRUS 2 BY RT PCR (HOSPITAL ORDER, PERFORMED IN ~~LOC~~ HOSPITAL LAB): SARS Coronavirus 2: NEGATIVE

## 2019-12-01 MED ORDER — IPRATROPIUM-ALBUTEROL 0.5-2.5 (3) MG/3ML IN SOLN: 3.0000 mL | Freq: Two times a day (BID) | RESPIRATORY_TRACT | 0 refills | Status: AC

## 2019-12-01 MED ORDER — NAPHAZOLINE-GLYCERIN 0.012-0.2 % OP SOLN: 1.0000 [drp] | Freq: Four times a day (QID) | OPHTHALMIC | 0 refills | Status: AC | PRN

## 2019-12-01 MED ORDER — METOPROLOL SUCCINATE ER 50 MG PO TB24: 50.0000 mg | ORAL_TABLET | Freq: Every day | ORAL | 0 refills | Status: DC

## 2019-12-01 MED ORDER — POTASSIUM CHLORIDE CRYS ER 20 MEQ PO TBCR: 20.0000 meq | EXTENDED_RELEASE_TABLET | Freq: Every day | ORAL | 0 refills | Status: DC

## 2019-12-01 MED ORDER — FUROSEMIDE 40 MG PO TABS: 40.0000 mg | ORAL_TABLET | Freq: Every day | ORAL | 0 refills | Status: DC

## 2019-12-01 NOTE — Care Management Important Message (Signed)
Important Message  Patient Details  Name: Kathleen Barton MRN: [id number] Date of Birth: 1940-08-12   Medicare Important Message Given:  Yes     Johnell Comings 12/01/2019, 11:50 AM

## 2019-12-01 NOTE — Plan of Care (Signed)
  Problem: Clinical Measurements: Goal: Respiratory complications will improve Outcome: Progressing   Problem: Safety: Goal: Ability to remain free from injury will improve Outcome: Progressing   Problem: Skin Integrity: Goal: Risk for impaired skin integrity will decrease Outcome: Progressing   

## 2019-12-01 NOTE — TOC Progression Note (Signed)
Transition of Care [facility]) - Progression Note    Patient Details  Name: Kathleen Barton MRN: [id number] Date of Birth: 06/27/1940  [facility] CM/SW Contact  Maree Krabbe, LCSW Phone Number: 12/01/2019, 1:57 PM  Clinical Narrative:   CSW got a response from [facility] regarding the Bipap. It has been ordered and will arrive at the facility tomorrow. CSW called [employer] and Berkley Harvey is good through tomorrow so as long as pt d/c to facility tomorrow we do not need to do anything. MD notified to order new covid.    Expected Discharge Plan: Skilled Nursing Facility Barriers to Discharge: Continued Medical Work up  Expected Discharge Plan and Services Expected Discharge Plan: Skilled Nursing Facility In-house Referral: Clinical Social Work   Post Acute Care Choice: Skilled Nursing Facility Living arrangements for the past 2 months: Single Family Home Expected Discharge Date: 12/01/19                                     Social Determinants of Health (SDOH) Interventions    Readmission Risk Interventions No flowsheet data found.

## 2019-12-01 NOTE — Discharge Summary (Signed)
[facility] [place] at [facility]   PATIENT NAME: Kathleen Barton    MR#:  [id number]  DATE OF BIRTH:  1940-08-06  DATE OF ADMISSION:  11/24/2019 ADMITTING PHYSICIAN: Pennie Banter, DO  DATE OF DISCHARGE: 12/01/2019  PRIMARY CARE PHYSICIAN: [facility]    ADMISSION DIAGNOSIS:  Acute hypoxic respiratory failure Acute metabolic encephalopathy  DISCHARGE DIAGNOSIS:  Acute on chronic hypoxic hypercarbic respiratory failure Acute metabolic encephalopathy Acute diastolic congestive heart failure  SECONDARY DIAGNOSIS:   Past Medical History:  Diagnosis Date  . Hypertension   . Urinary incontinence     HOSPITAL COURSE:  1.  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  Patient down to 2 L of oxygen.  Continue BiPAP at night and for naps. 2.  Acute diastolic congestive heart failure with cardiomegaly.  EF normal on echocardiogram.  Continue oral Lasix.  Patient on Toprol-XL. 3.  Morbid obesity with a BMI of 47.44.  Continue to monitor weights on a daily basis. 4.  Acute metabolic encephalopathy.  Patient had visual hallucinations prior to coming into the hospital.  The patient has not had any further visual hallucinations.  Likely secondary to hypoxia. 5.  Acute kidney injury on chronic kidney disease stage IIIa.  Continue Lasix for diuresis.  Creatinine variable during the hospital course.  Creatinine 1.0 upon discharge.  Creatinine has ranged from 0.88 up to 1.14. 6.  Weakness.  Physical therapy recommends rehab 7.  History of DVT on Eliquis 8.  Hematuria secondary to Eliquis.  Hemoglobin stable.  Can refer to urology as outpatient.  DISCHARGE CONDITIONS:   Satisfactory  CONSULTS OBTAINED:  None  DRUG ALLERGIES:  No Known Allergies  DISCHARGE MEDICATIONS:   Allergies as of 12/01/2019   No Known Allergies     Medication List    STOP taking these medications   atenolol-chlorthalidone 50-25 MG tablet Commonly known as:  TENORETIC     TAKE these medications   apixaban 5 MG Tabs tablet Commonly known as: Eliquis Take 1 tablet (5 mg total) by mouth 2 (two) times daily.   cholecalciferol 1000 units tablet Commonly known as: VITAMIN D Take 1,000 Units by mouth daily.   cyanocobalamin 1000 MCG tablet Take 1,000 mcg by mouth daily.   furosemide 40 MG tablet Commonly known as: LASIX Take 1 tablet (40 mg total) by mouth daily.   ipratropium-albuterol 0.5-2.5 (3) MG/3ML Soln Commonly known as: DUONEB Take 3 mLs by nebulization 2 (two) times daily.   meclizine 25 MG tablet Commonly known as: ANTIVERT Take 25 mg by mouth 3 (three) times daily as needed for dizziness.   metoprolol succinate 50 MG 24 hr tablet Commonly known as: TOPROL-XL Take 1 tablet (50 mg total) by mouth daily. Take with or immediately following a meal.   naphazoline-glycerin 0.012-0.2 % Soln Commonly known as: CLEAR EYES REDNESS Place 1-2 drops into both eyes 4 (four) times daily as needed for eye irritation.   PARoxetine 30 MG tablet Commonly known as: PAXIL Take 1 tablet (30 mg total) by mouth daily.   potassium chloride SA 20 MEQ tablet Commonly known as: KLOR-CON Take 1 tablet (20 mEq total) by mouth daily.        DISCHARGE INSTRUCTIONS:   Follow-up with Dr. Fuller Mandril 1 day  If you experience worsening of your admission symptoms, develop shortness of breath, life threatening emergency, suicidal or homicidal thoughts you must seek medical attention immediately by calling 911 or calling your MD immediately  if symptoms less  severe.  You Must read complete instructions/literature along with all the possible adverse reactions/side effects for all the Medicines you take and that have been prescribed to you. Take any new Medicines after you have completely understood and accept all the possible adverse reactions/side effects.   Please note  You were cared for by a hospitalist during your hospital stay. If you have any  questions about your discharge medications or the care you received while you were in the hospital after you are discharged, you can call the unit and asked to speak with the hospitalist on call if the hospitalist that took care of you is not available. Once you are discharged, your primary care physician will handle any further medical issues. Please note that NO REFILLS for any discharge medications will be authorized once you are discharged, as it is imperative that you return to your primary care physician (or establish a relationship with a primary care physician if you do not have one) for your aftercare needs so that they can reassess your need for medications and monitor your lab values.    Today   CHIEF COMPLAINT:   Chief Complaint  Patient presents with  . Altered Mental Status    HISTORY OF PRESENT ILLNESS:  Kathleen Barton  is a 79 y.o. female brought in with altered mental status   VITAL SIGNS:  Blood pressure (!) 151/61, pulse 78, temperature 98.1 F (36.7 C), temperature source Oral, resp. rate 18, height 5\' 8"  (1.727 m), weight (!) 141.5 kg, SpO2 93 %.  I/O:    Intake/Output Summary (Last 24 hours) at 12/01/2019 0916 Last data filed at 11/30/2019 1552 Gross per 24 hour  Intake 240 ml  Output 1550 ml  Net -1310 ml    PHYSICAL EXAMINATION:  GENERAL:  79 y.o.-year-old patient lying in the bed with no acute distress.  EYES: Pupils equal, round, reactive to light and accommodation. No scleral icterus. HEENT: Head atraumatic, normocephalic. Oropharynx and nasopharynx clear.  LUNGS: Decreased breath sounds bilateral bases, no wheezing, rales,rhonchi or crepitation. No use of accessory muscles of respiration.  CARDIOVASCULAR: S1, S2 normal. No murmurs, rubs, or gallops.  ABDOMEN: Soft, non-tender, non-distended. EXTREMITIES: Trace pedal edema.  NEUROLOGIC: Cranial nerves II through XII are intact. Muscle strength 5/5 in all extremities. Sensation intact. Gait not checked.   PSYCHIATRIC: The patient is alert and oriented x 3.  SKIN: No obvious rash, lesion, or ulcer.   DATA REVIEW:   CBC Recent Labs  Lab 11/28/19 0503  WBC 8.8  HGB 13.3  HCT 41.0  PLT 201    Chemistries  Recent Labs  Lab 12/01/19 0746  NA 135  K 3.7  CL 91*  CO2 35*  GLUCOSE 128*  BUN 28*  CREATININE 1.00  CALCIUM 8.7*    Microbiology Results  Results for orders placed or performed during the hospital encounter of 11/24/19  SARS Coronavirus 2 by RT PCR (hospital order, performed in [facility] hospital lab) Nasopharyngeal Nasopharyngeal Swab     Status: None   Collection Time: 11/24/19  7:22 AM   Specimen: Nasopharyngeal Swab  Result Value Ref Range Status   SARS Coronavirus 2 NEGATIVE NEGATIVE Final    Comment: (NOTE) SARS-CoV-2 target nucleic acids are NOT DETECTED.  The SARS-CoV-2 RNA is generally detectable in upper and lower respiratory specimens during the acute phase of infection. The lowest concentration of SARS-CoV-2 viral copies this assay can detect is 250 copies / mL. A negative result does not preclude SARS-CoV-2 infection and  should not be used as the sole basis for treatment or other patient management decisions.  A negative result may occur with improper specimen collection / handling, submission of specimen other than nasopharyngeal swab, presence of viral mutation(s) within the areas targeted by this assay, and inadequate number of viral copies (<250 copies / mL). A negative result must be combined with clinical observations, patient history, and epidemiological information.  Fact Sheet for Patients:   BoilerBrush.com.cy  Fact Sheet for Healthcare Providers: https://Franchino.com/  This test is not yet approved or  cleared by the [place] FDA and has been authorized for detection and/or diagnosis of SARS-CoV-2 by FDA under an Emergency Use Authorization (EUA).  This EUA will remain in effect  (meaning this test can be used) for the duration of the COVID-19 declaration under Section 564(b)(1) of the Act, 21 U.S.C. section 360bbb-3(b)(1), unless the authorization is terminated or revoked sooner.  Performed at [facility], [address], [place], [place] [address]      Management plans discussed with the patient, and she is in agreement.  CODE STATUS:     Code Status Orders  (From admission, onward)         Start     Ordered   11/24/19 1323  Full code  Continuous        11/24/19 1322        Code Status History    Date Active Date Inactive Code Status Order ID Comments User Context   11/24/2019 1116 11/24/2019 1322 DNR [id number]  Lorretta Harp, MD ED   Advance Care Planning Activity      TOTAL TIME TAKING CARE OF THIS PATIENT: 35 minutes.    Alford Highland M.D on 12/01/2019 at 9:16 AM  Between 7am to 6pm - Pager - [phone]  After 6pm go to [email or web address] - password [email or web address]  [facility]  CC: Primary care physician; [facility]

## 2019-12-01 NOTE — Plan of Care (Signed)
  Problem: Education: Goal: Knowledge of General Education information will improve Description: Including pain rating scale, medication(s)/side effects and non-pharmacologic comfort measures Outcome: Progressing   Problem: Safety: Goal: Ability to remain free from injury will improve Outcome: Progressing   Problem: Skin Integrity: Goal: Risk for impaired skin integrity will decrease Outcome: Progressing Note: Pt received skin care( powder,Barrier cream)

## 2019-12-01 NOTE — Progress Notes (Signed)
Patient ID: LUNETTA MARINA, female   DOB: May 17, 1940, 79 y.o.   MRN: [id number] [facility] PROGRESS NOTE  LECIA ESPERANZA HQI:[id number] DOB: [birth year] DOA: 11/24/2019 PCP: [facility]  HPI/Subjective: Just notified that the facility does not have the BiPAP yet and will have a by tomorrow.  Patient seen this morning and was feeling better.  Had questions about her medications.  She states that she is urinating well.  Objective: Vitals:   12/01/19 0830 12/01/19 1110  BP:  (!) 152/63  Pulse:  79  Resp:  19  Temp:  97.8 F (36.6 C)  SpO2: 93% 90%    Intake/Output Summary (Last 24 hours) at 12/01/2019 1351 Last data filed at 11/30/2019 1552 Gross per 24 hour  Intake --  Output 1000 ml  Net -1000 ml   Filed Weights   11/29/19 0509 11/30/19 0450 12/01/19 0452  Weight: (!) 142.2 kg (!) 141.5 kg (!) 141.5 kg    ROS: Review of Systems  Respiratory: Negative for shortness of breath.   Cardiovascular: Negative for chest pain.  Gastrointestinal: Negative for abdominal pain.   Exam: Physical Exam HENT:     Nose: No mucosal edema.     Mouth/Throat:     Pharynx: No oropharyngeal exudate.  Eyes:     Conjunctiva/sclera: Conjunctivae normal.     Pupils: Pupils are equal, round, and reactive to light.  Cardiovascular:     Rate and Rhythm: Normal rate and regular rhythm.     Heart sounds: Normal heart sounds, S1 normal and S2 normal.  Pulmonary:     Breath sounds: Examination of the right-lower field reveals decreased breath sounds. Examination of the left-lower field reveals decreased breath sounds. Decreased breath sounds present. No wheezing, rhonchi or rales.  Abdominal:     Palpations: Abdomen is soft.     Tenderness: There is no abdominal tenderness.  Musculoskeletal:     Right ankle: No swelling.     Left ankle: No swelling.  Skin:    General: Skin is warm.     Findings: No rash.  Neurological:     Mental Status: She is alert and oriented to person, place,  and time.       Data Reviewed: Basic Metabolic Panel: Recent Labs  Lab 11/27/19 0723 11/28/19 0503 11/29/19 0458 11/30/19 0700 12/01/19 0746  NA 138 137 136 136 135  K 3.4* 3.6 3.5 3.6 3.7  CL 94* 94* 90* 90* 91*  CO2 33* 33* 34* 34* 35*  GLUCOSE 249* 156* 150* 138* 128*  BUN 20 20 23  28* 28*  CREATININE 0.97 1.03* 1.14* 1.10* 1.00  CALCIUM 8.7* 8.5* 8.8* 8.9 8.7*   CBC: Recent Labs  Lab 11/25/19 0436 11/28/19 0503  WBC 9.4 8.8  HGB 13.8 13.3  HCT 45.1 41.0  MCV 98.3 94.5  PLT 218 201   BNP (last 3 results) Recent Labs    11/24/19 0722  BNP 93.8     Recent Results (from the past 240 hour(s))  SARS Coronavirus 2 by RT PCR (hospital order, performed in [facility] hospital lab) Nasopharyngeal Nasopharyngeal Swab     Status: None   Collection Time: 11/24/19  7:22 AM   Specimen: Nasopharyngeal Swab  Result Value Ref Range Status   SARS Coronavirus 2 NEGATIVE NEGATIVE Final    Comment: (NOTE) SARS-CoV-2 target nucleic acids are NOT DETECTED.  The SARS-CoV-2 RNA is generally detectable in upper and lower respiratory specimens during the acute phase of infection. The lowest concentration of  SARS-CoV-2 viral copies this assay can detect is 250 copies / mL. A negative result does not preclude SARS-CoV-2 infection and should not be used as the sole basis for treatment or other patient management decisions.  A negative result may occur with improper specimen collection / handling, submission of specimen other than nasopharyngeal swab, presence of viral mutation(s) within the areas targeted by this assay, and inadequate number of viral copies (<250 copies / mL). A negative result must be combined with clinical observations, patient history, and epidemiological information.  Fact Sheet for Patients:   BoilerBrush.com.cy  Fact Sheet for Healthcare Providers: https://[place].com/  This test is not yet approved or   cleared by the [place] FDA and has been authorized for detection and/or diagnosis of SARS-CoV-2 by FDA under an Emergency Use Authorization (EUA).  This EUA will remain in effect (meaning this test can be used) for the duration of the COVID-19 declaration under Section 564(b)(1) of the Act, 21 U.S.C. section 360bbb-3(b)(1), unless the authorization is terminated or revoked sooner.  Performed at [facility], [address] [place], [place] [address]       Scheduled Meds: . apixaban  5 mg Oral BID  . cholecalciferol  1,000 Units Oral Daily  . furosemide  40 mg Oral Daily  . ipratropium-albuterol  3 mL Nebulization BID  . metoprolol succinate  50 mg Oral Daily  . PARoxetine  30 mg Oral Daily  . potassium chloride  20 mEq Oral Daily  . sodium chloride flush  3 mL Intravenous Q12H  . cyanocobalamin  1,000 mcg Oral Daily   Continuous Infusions:  Assessment/Plan:  1. 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  Continue 2 L of oxygen chronically.  Continue BiPAP at night and for naps. 2. Acute on chronic diastolic congestive heart failure with cardiomegaly.  Continue Toprol-XL and oral Lasix.  EF normal on echocardiogram 3. Morbid obesity with a BMI of 47.44. 4. Acute metabolic encephalopathy this has improved 5. Acute on chronic kidney disease stage IIIa. 6. Weakness.  Physical therapy recommends rehab 7. History of DVT on Eliquis 8. Hematuria.  Can follow-up with urology as outpatient        Code Status:     Code Status Orders  (From admission, onward)         Start     Ordered   11/24/19 1323  Full code  Continuous        11/24/19 1322        Code Status History    Date Active Date Inactive Code Status Order ID Comments User Context   11/24/2019 1116 11/24/2019 1322 DNR [id number]  Lorretta Harp, MD ED   Advance Care Planning Activity     Disposition Plan: Status is: Inpatient  Dispo: The patient is from: Home               Anticipated d/c is to: Rehab              Anticipated d/c date is: Just notified the facility will have the BiPAP by tomorrow.  Discharge on 12/02/2019              Patient currently medically stable to go to rehab  Time spent: 40 minutes total to the time today. (having to do another note).  Neddie Steedman [employer]  [facility] [employer]

## 2019-12-02 DIAGNOSIS — J9620 Acute and chronic respiratory failure, unspecified whether with hypoxia or hypercapnia: Secondary | ICD-10-CM | POA: Diagnosis not present

## 2019-12-02 DIAGNOSIS — Z7401 Bed confinement status: Secondary | ICD-10-CM | POA: Diagnosis not present

## 2019-12-02 DIAGNOSIS — Z86718 Personal history of other venous thrombosis and embolism: Secondary | ICD-10-CM | POA: Diagnosis not present

## 2019-12-02 DIAGNOSIS — R300 Dysuria: Secondary | ICD-10-CM | POA: Diagnosis not present

## 2019-12-02 DIAGNOSIS — J9601 Acute respiratory failure with hypoxia: Secondary | ICD-10-CM | POA: Diagnosis not present

## 2019-12-02 DIAGNOSIS — Z03818 Encounter for observation for suspected exposure to other biological agents ruled out: Secondary | ICD-10-CM | POA: Diagnosis not present

## 2019-12-02 DIAGNOSIS — U071 COVID-19: Secondary | ICD-10-CM | POA: Diagnosis not present

## 2019-12-02 DIAGNOSIS — M6281 Muscle weakness (generalized): Secondary | ICD-10-CM | POA: Diagnosis not present

## 2019-12-02 DIAGNOSIS — I5031 Acute diastolic (congestive) heart failure: Secondary | ICD-10-CM | POA: Diagnosis not present

## 2019-12-02 DIAGNOSIS — M2550 Pain in unspecified joint: Secondary | ICD-10-CM | POA: Diagnosis not present

## 2019-12-02 DIAGNOSIS — I5033 Acute on chronic diastolic (congestive) heart failure: Secondary | ICD-10-CM | POA: Diagnosis not present

## 2019-12-02 DIAGNOSIS — N179 Acute kidney failure, unspecified: Secondary | ICD-10-CM | POA: Diagnosis not present

## 2019-12-02 DIAGNOSIS — R278 Other lack of coordination: Secondary | ICD-10-CM | POA: Diagnosis not present

## 2019-12-02 DIAGNOSIS — Z743 Need for continuous supervision: Secondary | ICD-10-CM | POA: Diagnosis not present

## 2019-12-02 DIAGNOSIS — G9341 Metabolic encephalopathy: Secondary | ICD-10-CM | POA: Diagnosis not present

## 2019-12-02 DIAGNOSIS — J9621 Acute and chronic respiratory failure with hypoxia: Secondary | ICD-10-CM | POA: Diagnosis not present

## 2019-12-02 DIAGNOSIS — R531 Weakness: Secondary | ICD-10-CM | POA: Diagnosis not present

## 2019-12-02 DIAGNOSIS — R0902 Hypoxemia: Secondary | ICD-10-CM | POA: Diagnosis not present

## 2019-12-02 DIAGNOSIS — R2681 Unsteadiness on feet: Secondary | ICD-10-CM | POA: Diagnosis not present

## 2019-12-02 LAB — BASIC METABOLIC PANEL
Anion gap: 11 (ref 5–15)
BUN: 31 mg/dL — ABNORMAL HIGH (ref 8–23)
CO2: 33 mmol/L — ABNORMAL HIGH (ref 22–32)
Calcium: 9.1 mg/dL (ref 8.9–10.3)
Chloride: 92 mmol/L — ABNORMAL LOW (ref 98–111)
Creatinine, Ser: 0.94 mg/dL (ref 0.44–1.00)
GFR calc Af Amer: >60 mL/min (ref >60)
GFR calc non Af Amer: 57 mL/min — ABNORMAL LOW (ref >60)
Glucose, Bld: 127 mg/dL — ABNORMAL HIGH (ref 70–99)
Potassium: 3.5 mmol/L (ref 3.5–5.1)
Sodium: 136 mmol/L (ref 135–145)

## 2019-12-02 NOTE — Clinical Social Work Note (Signed)
Patient continues to exhibit signs of hypercapnia associated with morbid obesity that is causing thoracic restriction.  Interruption or failure to provide NIV would quickly lead to exacerbation of the patient's condition, hospital admission, and likely harm to the patient. Continued use is preferred.  The use of the NIV will treat patient's high PC02 levels and can reduce risk of exacerbations and future hospitalizations when used at night and during the day.  BiLevel/RAD has been considered and ruled out as patient requires continuous alarms, backup battery, and portability which are not possible with BiLevel/RAD devices.  Ventilation is required to decrease the work of breathing and improve pulmonary status. Interruption of ventilator support would lead to decline of health status.  Patient is able to protect their airways and clear secretions on their own.  [place], [place] [phone]

## 2019-12-02 NOTE — TOC Transition Note (Signed)
Transition of Care [facility]) - CM/SW Discharge Note   Patient Details  Name: Kathleen Barton MRN: [id number] Date of Birth: 1940/07/07  [facility]) CM/SW Contact:  Maree Krabbe, LCSW Phone Number: 12/02/2019, 1:46 PM   Clinical Narrative:  Clinical Social Worker facilitated patient discharge including contacting patient family and facility to confirm patient discharge plans.  Clinical information faxed to facility and family agreeable with plan.  CSW arranged ambulance transport via [facility] to [facility].  RN to call for report prior to discharge.   Final next level of care: Skilled Nursing Facility Barriers to Discharge: No Barriers Identified   Patient Goals and CMS Choice Patient states their goals for this hospitalization and ongoing recovery are:: for pt to get stronger   Choice offered to / list presented to : Adult Children  Discharge Placement              Patient [facility] Patient to be transferred to facility by: [facility] Name of family member notified: Amy, Daughter in law Patient and family notified of of transfer: 12/02/19  Discharge Plan and Services In-house Referral: Clinical Social Work   Post Acute Care Choice: Skilled Nursing Facility                               Social Determinants of Health (SDOH) Interventions     Readmission Risk Interventions No flowsheet data found.

## 2019-12-02 NOTE — Progress Notes (Signed)
Report given to Jodie, nurse receiving patient at [facility] place. Pt will be transferred to SNF by EMS.

## 2019-12-02 NOTE — TOC Progression Note (Signed)
Transition of Care [facility]) - Progression Note    Patient Details  Name: Kathleen Barton MRN: [id number] Date of Birth: 12/10/1940  Transition of Care [facility]) CM/SW Contact  Maree Krabbe, LCSW Phone Number: 12/02/2019, 10:36 AM  Clinical Narrative:   Plan is for pt to d/c to [facility] today. Daughter in law, Amy, notified. Awaiting confirmation from [facility] that the Bipap has been delivered, then CSW will set up transport.    Expected Discharge Plan: Skilled Nursing Facility Barriers to Discharge: Continued Medical Work up  Expected Discharge Plan and Services Expected Discharge Plan: Skilled Nursing Facility In-house Referral: Clinical Social Work   Post Acute Care Choice: Skilled Nursing Facility Living arrangements for the past 2 months: Single Family Home Expected Discharge Date: 12/02/19                                     Social Determinants of Health (SDOH) Interventions    Readmission Risk Interventions No flowsheet data found.

## 2019-12-02 NOTE — Progress Notes (Signed)
Patient ID: Kathleen Barton, female   DOB: December 07, 1940, 79 y.o.   MRN: [id number] [facility] PROGRESS NOTE  Kathleen Barton:[id number] DOB: 01-31-1941 DOA: 11/24/2019 PCP: [facility]  HPI/Subjective: Patient feels okay.  Asking questions about her heart.  Patient has been stable for discharge to rehab since Friday.  Objective: Vitals:   12/02/19 0512 12/02/19 0822  BP: (!) 138/50 (!) 151/52  Pulse: 74 75  Resp: 20 20  Temp: 98.1 F (36.7 C) 98.2 F (36.8 C)  SpO2: 95% 93%    Intake/Output Summary (Last 24 hours) at 12/02/2019 0900 Last data filed at 12/02/2019 0124 Gross per 24 hour  Intake 480 ml  Output 1400 ml  Net -920 ml   Filed Weights   11/29/19 0509 11/30/19 0450 12/01/19 0452  Weight: (!) 142.2 kg (!) 141.5 kg (!) 141.5 kg    ROS: Review of Systems  Respiratory: Negative for shortness of breath.   Cardiovascular: Negative for chest pain.  Gastrointestinal: Negative for abdominal pain.   Exam: Physical Exam HENT:     Mouth/Throat:     Pharynx: No oropharyngeal exudate.  Eyes:     General: Lids are normal.     Pupils: Pupils are equal, round, and reactive to light.  Cardiovascular:     Rate and Rhythm: Normal rate and regular rhythm.     Heart sounds: Normal heart sounds and S1 normal.  Pulmonary:     Breath sounds: Examination of the right-lower field reveals decreased breath sounds. Examination of the left-lower field reveals decreased breath sounds. Decreased breath sounds present. No wheezing, rhonchi or rales.  Abdominal:     Palpations: Abdomen is soft.     Tenderness: There is no abdominal tenderness.  Musculoskeletal:     Right lower leg: Swelling present.     Left lower leg: Swelling present.  Skin:    General: Skin is warm.     Findings: No rash.  Neurological:     Mental Status: She is alert and oriented to person, place, and time.       Data Reviewed: Basic Metabolic Panel: Recent Labs  Lab 11/28/19 0503  11/29/19 0458 11/30/19 0700 12/01/19 0746 12/02/19 0446  NA 137 136 136 135 136  K 3.6 3.5 3.6 3.7 3.5  CL 94* 90* 90* 91* 92*  CO2 33* 34* 34* 35* 33*  GLUCOSE 156* 150* 138* 128* 127*  BUN 20 23 28* 28* 31*  CREATININE 1.03* 1.14* 1.10* 1.00 0.94  CALCIUM 8.5* 8.8* 8.9 8.7* 9.1   CBC: Recent Labs  Lab 11/28/19 0503  WBC 8.8  HGB 13.3  HCT 41.0  MCV 94.5  PLT 201   BNP (last 3 results) Recent Labs    11/24/19 0722  BNP 93.8      Recent Results (from the past 240 hour(s))  SARS Coronavirus 2 by RT PCR (hospital order, performed in [facility] hospital lab) Nasopharyngeal Nasopharyngeal Swab     Status: None   Collection Time: 11/24/19  7:22 AM   Specimen: Nasopharyngeal Swab  Result Value Ref Range Status   SARS Coronavirus 2 NEGATIVE NEGATIVE Final    Comment: (NOTE) SARS-CoV-2 target nucleic acids are NOT DETECTED.  The SARS-CoV-2 RNA is generally detectable in upper and lower respiratory specimens during the acute phase of infection. The lowest concentration of SARS-CoV-2 viral copies this assay can detect is 250 copies / mL. A negative result does not preclude SARS-CoV-2 infection and should not be used as the sole  basis for treatment or other patient management decisions.  A negative result may occur with improper specimen collection / handling, submission of specimen other than nasopharyngeal swab, presence of viral mutation(s) within the areas targeted by this assay, and inadequate number of viral copies (<250 copies / mL). A negative result must be combined with clinical observations, patient history, and epidemiological information.  Fact Sheet for Patients:   BoilerBrush.com.cy  Fact Sheet for Healthcare Providers: https://Koepp.com/  This test is not yet approved or  cleared by the [place] FDA and has been authorized for detection and/or diagnosis of SARS-CoV-2 by FDA under an Emergency Use  Authorization (EUA).  This EUA will remain in effect (meaning this test can be used) for the duration of the COVID-19 declaration under Section 564(b)(1) of the Act, 21 U.S.C. section 360bbb-3(b)(1), unless the authorization is terminated or revoked sooner.  Performed at [facility], [address] [place], [place] [address]   SARS Coronavirus 2 by RT PCR (hospital order, performed in [facility] hospital lab) Nasopharyngeal Nasopharyngeal Swab     Status: None   Collection Time: 12/01/19  2:25 PM   Specimen: Nasopharyngeal Swab  Result Value Ref Range Status   SARS Coronavirus 2 NEGATIVE NEGATIVE Final    Comment: (NOTE) SARS-CoV-2 target nucleic acids are NOT DETECTED.  The SARS-CoV-2 RNA is generally detectable in upper and lower respiratory specimens during the acute phase of infection. The lowest concentration of SARS-CoV-2 viral copies this assay can detect is 250 copies / mL. A negative result does not preclude SARS-CoV-2 infection and should not be used as the sole basis for treatment or other patient management decisions.  A negative result may occur with improper specimen collection / handling, submission of specimen other than nasopharyngeal swab, presence of viral mutation(s) within the areas targeted by this assay, and inadequate number of viral copies (<250 copies / mL). A negative result must be combined with clinical observations, patient history, and epidemiological information.  Fact Sheet for Patients:   BoilerBrush.com.cy  Fact Sheet for Healthcare Providers: https://Delfino.com/  This test is not yet approved or  cleared by the [place] FDA and has been authorized for detection and/or diagnosis of SARS-CoV-2 by FDA under an Emergency Use Authorization (EUA).  This EUA will remain in effect (meaning this test can be used) for the duration of the COVID-19 declaration under Section 564(b)(1) of the  Act, 21 U.S.C. section 360bbb-3(b)(1), unless the authorization is terminated or revoked sooner.  Performed at [facility], [address] [place], [place] [address]      Studies: No results found.  Scheduled Meds: . apixaban  5 mg Oral BID  . cholecalciferol  1,000 Units Oral Daily  . furosemide  40 mg Oral Daily  . ipratropium-albuterol  3 mL Nebulization BID  . metoprolol succinate  50 mg Oral Daily  . PARoxetine  30 mg Oral Daily  . potassium chloride  20 mEq Oral Daily  . sodium chloride flush  3 mL Intravenous Q12H  . cyanocobalamin  1,000 mcg Oral Daily   Continuous Infusions:  Assessment/Plan:  1. 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  Continue 2 L of oxygen chronically.  Continue BiPAP at night and for naps. 2. Acute on chronic diastolic congestive heart failure with cardiomegaly.  Continue Toprol-XL and oral Lasix.  EF normal on echocardiogram. 3. Morbid obesity 4. Acute metabolic encephalopathy has improved during the hospital course likely secondary to hypoxia 5. Acute kidney injury on chronic  kidney disease stage III A.  Creatinine 0.94 upon disposition 6. Weakness.  Zickel therapy recommends rehab 7. History of DVT on Eliquis 8. Hematuria.  Hemoglobin stable can follow-up with urology as outpatient.  Discharge summary from yesterday applies.     Code Status:     Code Status Orders  (From admission, onward)         Start     Ordered   11/24/19 1323  Full code  Continuous        11/24/19 1322        Code Status History    Date Active Date Inactive Code Status Order ID Comments User Context   11/24/2019 1116 11/24/2019 1322 DNR [id number]  Lorretta Harp, MD ED   Advance Care Planning Activity     Family Communication:  Disposition Plan: Status is: Inpatient  Dispo: The patient is from: rehab              Anticipated d/c is to: rehab              Anticipated d/c date is: today              Patient currently medically  stable  Time spent: 24 minutes  [employer]

## 2019-12-03 DIAGNOSIS — J9621 Acute and chronic respiratory failure with hypoxia: Secondary | ICD-10-CM | POA: Diagnosis not present

## 2019-12-03 DIAGNOSIS — G9341 Metabolic encephalopathy: Secondary | ICD-10-CM | POA: Diagnosis not present

## 2019-12-05 DIAGNOSIS — R300 Dysuria: Secondary | ICD-10-CM | POA: Diagnosis not present

## 2019-12-08 DIAGNOSIS — G9341 Metabolic encephalopathy: Secondary | ICD-10-CM | POA: Diagnosis not present

## 2019-12-08 DIAGNOSIS — Z86718 Personal history of other venous thrombosis and embolism: Secondary | ICD-10-CM | POA: Diagnosis not present

## 2019-12-08 DIAGNOSIS — R531 Weakness: Secondary | ICD-10-CM | POA: Diagnosis not present

## 2019-12-08 DIAGNOSIS — J9621 Acute and chronic respiratory failure with hypoxia: Secondary | ICD-10-CM | POA: Diagnosis not present

## 2019-12-19 ENCOUNTER — Telehealth: Payer: Self-pay

## 2019-12-19 DIAGNOSIS — J9601 Acute respiratory failure with hypoxia: Secondary | ICD-10-CM | POA: Diagnosis not present

## 2019-12-19 DIAGNOSIS — J9621 Acute and chronic respiratory failure with hypoxia: Secondary | ICD-10-CM | POA: Diagnosis not present

## 2019-12-19 DIAGNOSIS — U071 COVID-19: Secondary | ICD-10-CM | POA: Diagnosis not present

## 2019-12-19 DIAGNOSIS — G9341 Metabolic encephalopathy: Secondary | ICD-10-CM | POA: Diagnosis not present

## 2019-12-19 DIAGNOSIS — R531 Weakness: Secondary | ICD-10-CM | POA: Diagnosis not present

## 2019-12-19 DIAGNOSIS — J9620 Acute and chronic respiratory failure, unspecified whether with hypoxia or hypercapnia: Secondary | ICD-10-CM | POA: Diagnosis not present

## 2019-12-19 DIAGNOSIS — I5033 Acute on chronic diastolic (congestive) heart failure: Secondary | ICD-10-CM | POA: Diagnosis not present

## 2019-12-19 NOTE — Telephone Encounter (Signed)
Kathleen Barton called and said that Pt's daughter in law would like for Dr. Birdie Sons to be pt's doctor again. They are not happy with doctors making house calls. Will he accept patient again? Last office with Dr. Birdie Sons was 09/30/19.

## 2019-12-24 ENCOUNTER — Ambulatory Visit: Payer: Self-pay | Admitting: Urology

## 2019-12-26 ENCOUNTER — Telehealth: Payer: Self-pay

## 2019-12-26 ENCOUNTER — Telehealth: Payer: Self-pay | Admitting: Family Medicine

## 2019-12-26 DIAGNOSIS — N1831 Chronic kidney disease, stage 3a: Secondary | ICD-10-CM | POA: Diagnosis not present

## 2019-12-26 DIAGNOSIS — J9621 Acute and chronic respiratory failure with hypoxia: Secondary | ICD-10-CM | POA: Diagnosis not present

## 2019-12-26 DIAGNOSIS — I5031 Acute diastolic (congestive) heart failure: Secondary | ICD-10-CM | POA: Diagnosis not present

## 2019-12-26 DIAGNOSIS — I130 Hypertensive heart and chronic kidney disease with heart failure and stage 1 through stage 4 chronic kidney disease, or unspecified chronic kidney disease: Secondary | ICD-10-CM | POA: Diagnosis not present

## 2019-12-26 DIAGNOSIS — J9620 Acute and chronic respiratory failure, unspecified whether with hypoxia or hypercapnia: Secondary | ICD-10-CM | POA: Diagnosis not present

## 2019-12-26 DIAGNOSIS — Z86718 Personal history of other venous thrombosis and embolism: Secondary | ICD-10-CM | POA: Diagnosis not present

## 2019-12-26 DIAGNOSIS — G9341 Metabolic encephalopathy: Secondary | ICD-10-CM | POA: Diagnosis not present

## 2019-12-26 DIAGNOSIS — Z9181 History of falling: Secondary | ICD-10-CM | POA: Diagnosis not present

## 2019-12-26 DIAGNOSIS — Z7901 Long term (current) use of anticoagulants: Secondary | ICD-10-CM | POA: Diagnosis not present

## 2019-12-26 NOTE — Telephone Encounter (Signed)
Patient called in for hospital follow up scheduled for 01-16-20 11:30

## 2020-01-01 NOTE — Telephone Encounter (Signed)
Patient discharged to SNF. Unable to complete TCM. °

## 2020-01-12 ENCOUNTER — Telehealth: Payer: Self-pay

## 2020-01-12 ENCOUNTER — Other Ambulatory Visit: Payer: Self-pay

## 2020-01-12 DIAGNOSIS — Z8639 Personal history of other endocrine, nutritional and metabolic disease: Secondary | ICD-10-CM

## 2020-01-12 MED ORDER — METOPROLOL SUCCINATE ER 50 MG PO TB24
50.0000 mg | ORAL_TABLET | Freq: Every day | ORAL | 0 refills | Status: DC
Start: 2020-01-12 — End: 2020-01-13

## 2020-01-12 NOTE — Telephone Encounter (Signed)
Pt needs a refill on metoprolol succinate (TOPROL-XL) 50 MG 24 hr tablet ASAP

## 2020-01-13 ENCOUNTER — Other Ambulatory Visit: Payer: Self-pay

## 2020-01-13 DIAGNOSIS — I10 Essential (primary) hypertension: Secondary | ICD-10-CM

## 2020-01-13 MED ORDER — METOPROLOL SUCCINATE ER 50 MG PO TB24
50.0000 mg | ORAL_TABLET | Freq: Every day | ORAL | 0 refills | Status: DC
Start: 2020-01-13 — End: 2020-02-04

## 2020-01-13 NOTE — Telephone Encounter (Signed)
Patient called in stated that the pharmacy does not have her prescription formetoprolol succinate (TOPROL-XL) 50 MG 24 hr tablet

## 2020-01-13 NOTE — Telephone Encounter (Signed)
Is she still taking Lasix?  If she still on that then she likely needs to have the potassium supplement at least until I see her for follow-up later this week.

## 2020-01-13 NOTE — Addendum Note (Signed)
Addended by: Elise Benne T on: 01/13/2020 10:16 AM   Modules accepted: Orders

## 2020-01-13 NOTE — Telephone Encounter (Signed)
It looks like they discontinued the chlorthalidone atenolol combination pill when she was hospitalized in August.  Did someone restart this after discharge?  Was she in a skilled nursing facility?  What has her blood pressure been running?

## 2020-01-15 ENCOUNTER — Telehealth: Payer: Self-pay

## 2020-01-15 NOTE — Telephone Encounter (Signed)
Pt called and rescheduled Hospital follow up to Tuesday 9/28. Pt wants a call back to know whether she needs to fast prior to appt/

## 2020-01-15 NOTE — Telephone Encounter (Signed)
I called and spoke to the nurse at the patient's residence she stated the residents did not have phones in their rooms and I informed her that the patient did not need to fast for her appointment on 9/28, she stated she would give her the message.  Anastacio Bua,cma

## 2020-01-16 ENCOUNTER — Ambulatory Visit: Payer: Medicare Other | Admitting: Family Medicine

## 2020-01-17 NOTE — Telephone Encounter (Signed)
I called the phone number listed and it was a skilled facility and the patient is no longer at that facility, I tried the home phone and received no answer and no VM. Amin Fornwalt,cma

## 2020-01-17 NOTE — Telephone Encounter (Signed)
Please try to call back again on Monday.

## 2020-01-19 DIAGNOSIS — J9620 Acute and chronic respiratory failure, unspecified whether with hypoxia or hypercapnia: Secondary | ICD-10-CM | POA: Diagnosis not present

## 2020-01-19 DIAGNOSIS — I5033 Acute on chronic diastolic (congestive) heart failure: Secondary | ICD-10-CM | POA: Diagnosis not present

## 2020-01-19 DIAGNOSIS — G9341 Metabolic encephalopathy: Secondary | ICD-10-CM | POA: Diagnosis not present

## 2020-01-19 DIAGNOSIS — J9601 Acute respiratory failure with hypoxia: Secondary | ICD-10-CM | POA: Diagnosis not present

## 2020-01-20 ENCOUNTER — Other Ambulatory Visit: Payer: Self-pay

## 2020-01-20 ENCOUNTER — Telehealth (INDEPENDENT_AMBULATORY_CARE_PROVIDER_SITE_OTHER): Payer: Medicare Other | Admitting: Family Medicine

## 2020-01-20 ENCOUNTER — Encounter: Payer: Self-pay | Admitting: Family Medicine

## 2020-01-20 ENCOUNTER — Telehealth: Payer: Self-pay | Admitting: Family Medicine

## 2020-01-20 VITALS — Ht 68.0 in | Wt 313.0 lb

## 2020-01-20 DIAGNOSIS — F330 Major depressive disorder, recurrent, mild: Secondary | ICD-10-CM

## 2020-01-20 DIAGNOSIS — I5031 Acute diastolic (congestive) heart failure: Secondary | ICD-10-CM

## 2020-01-20 DIAGNOSIS — J9621 Acute and chronic respiratory failure with hypoxia: Secondary | ICD-10-CM | POA: Diagnosis not present

## 2020-01-20 DIAGNOSIS — R319 Hematuria, unspecified: Secondary | ICD-10-CM | POA: Diagnosis not present

## 2020-01-20 DIAGNOSIS — J9622 Acute and chronic respiratory failure with hypercapnia: Secondary | ICD-10-CM

## 2020-01-20 DIAGNOSIS — I10 Essential (primary) hypertension: Secondary | ICD-10-CM

## 2020-01-20 NOTE — Progress Notes (Signed)
Virtual Visit via video Note  This visit type was conducted due to national recommendations for restrictions regarding the COVID-19 pandemic (e.g. social distancing).  This format is felt to be most appropriate for this patient at this time.  All issues noted in this document were discussed and addressed.  No physical exam was performed (except for noted visual exam findings with Video Visits).   I connected with Kathleen Barton today at  9:30 AM EDT by a video enabled telemedicine application or telephone and verified that I am speaking with the correct person using two identifiers. Location patient: home Location provider: work Persons participating in the virtual visit: patient, provider, Ashli Selders (daughter in law)  I discussed the limitations, risks, security and privacy concerns of performing an evaluation and management service by telephone and the availability of in person appointments. I also discussed with the patient that there may be a patient responsible charge related to this service. The patient expressed understanding and agreed to proceed.  Reason for visit: Follow-up.  HPI: Diastolic heart failure: Patient was recently hospitalized for this.  She developed shortness of breath and hypoxia.  She was treated with Lasix and started on supplemental oxygen 2 L by nasal cannula.  She was discharged to skilled nursing facility to complete rehab though she was not there long as she did not want to participate in physical therapy.  She notes her shortness of breath is better than when she went in the hospital.  She does still have some dyspnea with exertion.  No chest pain.  If she takes her Lasix she does not have swelling though if she does not take it she will get 3+ pitting edema bilaterally.  They have gone to taking the Lasix every other day given increased urination with daily dosing.  She is also on metoprolol XL.  Her oxygen saturations had been in the upper 90s and then the low 90s  with exertion with 2 L nasal cannula though she lost her pulse ox and has not been able to check this recently.  Feels generally weak and does not feel like getting up and moving around much.  They also note the patient was put on a BiPAP in the hospital and advised to use this at home.  She has trouble tolerating this.  They wonder if she needs a sleep study.  Hematuria: Noted while she was in the hospital.  They recommended urology follow-up.  Her daughter-in-law notes she called the urology office and they advised given that the patient has significant mobility issues to monitor for any recurrence and if it recurs to then call them.  They have not noticed any additional hematuria.    Depression: Does note some depressive symptoms.  Has been on Paxil for quite some time.  They wonder about changing this to something different.  They also note some memory issues where she is forgetting if she took her medications.   ROS: See pertinent positives and negatives per HPI.  Past Medical History:  Diagnosis Date  . Hypertension   . Urinary incontinence     Past Surgical History:  Procedure Laterality Date  . ABDOMINAL HYSTERECTOMY  1974    Family History  Problem Relation Age of Onset  . Heart disease Other   . Hypertension Other     SOCIAL HX: Non-smoker   Current Outpatient Medications:  .  apixaban (ELIQUIS) 5 MG TABS tablet, Take 1 tablet (5 mg total) by mouth 2 (two) times daily., Disp:  180 tablet, Rfl: 1 .  atenolol-chlorthalidone (TENORETIC) 50-25 MG tablet, Take 1 tablet by mouth daily., Disp: , Rfl:  .  cholecalciferol (VITAMIN D) 1000 units tablet, Take 1,000 Units by mouth daily., Disp: , Rfl:  .  cyanocobalamin 1000 MCG tablet, Take 1,000 mcg by mouth daily., Disp: , Rfl:  .  furosemide (LASIX) 40 MG tablet, Take 1 tablet (40 mg total) by mouth daily., Disp: 30 tablet, Rfl: 0 .  ipratropium-albuterol (DUONEB) 0.5-2.5 (3) MG/3ML SOLN, Take 3 mLs by nebulization 2 (two) times  daily., Disp: 360 mL, Rfl: 0 .  meclizine (ANTIVERT) 25 MG tablet, Take 25 mg by mouth 3 (three) times daily as needed for dizziness., Disp: , Rfl:  .  metoprolol succinate (TOPROL-XL) 50 MG 24 hr tablet, Take 1 tablet (50 mg total) by mouth daily. Take with or immediately following a meal., Disp: 30 tablet, Rfl: 0 .  naphazoline-glycerin (CLEAR EYES REDNESS) 0.012-0.2 % SOLN, Place 1-2 drops into both eyes 4 (four) times daily as needed for eye irritation., Disp: 6 mL, Rfl: 0 .  PARoxetine (PAXIL) 30 MG tablet, Take 1 tablet (30 mg total) by mouth daily., Disp: 90 tablet, Rfl: 1 .  potassium chloride SA (KLOR-CON) 20 MEQ tablet, Take 1 tablet (20 mEq total) by mouth daily., Disp: 30 tablet, Rfl: 0  EXAM:  VITALS per patient if applicable:  GENERAL: alert, oriented, appears well and in no acute distress  HEENT: atraumatic, conjunttiva clear, no obvious abnormalities on inspection of external nose and ears  NECK: normal movements of the head and neck  LUNGS: on inspection no signs of respiratory distress, breathing rate appears normal, no obvious gross SOB, gasping or wheezing  CV: no obvious cyanosis  MS: moves all visible extremities without noticeable abnormality  PSYCH/NEURO: pleasant and cooperative, speech and thought processing grossly intact  ASSESSMENT AND PLAN:  Discussed the following assessment and plan:  Acute diastolic congestive heart failure (HCC) Patient has recovered well.  Does still have some mild dyspnea on exertion which could be related to deconditioning and obesity.  She will continue Lasix every other day at this time.  We will arrange lab work and physical therapy through home health.  She will remain on 2 L nasal cannula for the moment.  If she has any worsening symptoms she will seek medical attention.  Depression Patient does note depression.  Also some memory issues which are likely related to depression.  She is been on Paxil for quite some time.  We  will discuss with our clinical pharmacist on transitioning Paxil to Zoloft and we will let the patient's daughter-in-law know.  The patient confirmed that we could contact her daughter-in-law regarding this at [phone].  Hematuria No recurrence.  They have deferred further evaluation for this at this time given her mobility issues.  If she has recurrence they will let us or the urology office know.   Orders Placed This Encounter  Procedures  . Ambulatory referral to Home Health    Referral Priority:   Routine    Referral Type:   Home Health Care    Referral Reason:   Specialty Services Required    Requested Specialty:   Home Health Services    Number of Visits Requested:   1  . Home sleep test    Order Specific Question:   Where should this test be performed:    Answer:   [place]    No orders of the defined types were placed in this encounter.  I discussed the assessment and treatment plan with the patient. The patient was provided an opportunity to ask questions and all were answered. The patient agreed with the plan and demonstrated an understanding of the instructions.   The patient was advised to call back or seek an in-person evaluation if the symptoms worsen or if the condition fails to improve as anticipated.   Marikay Alar, MD

## 2020-01-20 NOTE — Telephone Encounter (Signed)
Antony Contras with [facility] would like discharge orders for home health and PT. The daughter of the pt called and states that she decided to move the pt to skilled nursing. Please call @ [phone] and you can leave a verbal order on voicemail to discharge.

## 2020-01-20 NOTE — Telephone Encounter (Signed)
Please clarify.  Did she determine that she wanted to move her to skilled nursing after our visit that was completed earlier today?

## 2020-01-20 NOTE — Telephone Encounter (Signed)
I received a call from Amy Kapral the patient's daughter in law her number is [phone]. The first thing I informed her is to come to the office and sign a DPR so in the future we can discuss the patient.  A verbal order is needed to discharge the the patient from pt and home health with [facility].  The patient's daughter stated that the patient needs PT and home health and the provider and the patient had discussed this at her visit this morning, so a order is needed for the patient to have skilled nursing and PT at home health.  Elane Peabody,cma

## 2020-01-20 NOTE — Telephone Encounter (Signed)
I do not have any number for the patients daughter, I called and spoke with the patient and she did not know of what I was speaking of, she will call her daughter and give me a call back.  Carmine Carrozza,cma

## 2020-01-21 NOTE — Telephone Encounter (Signed)
Noted. You can give verbal orders to D/c the [facility]. I will place the order for home health.

## 2020-01-22 ENCOUNTER — Telehealth: Payer: Self-pay | Admitting: Family Medicine

## 2020-01-22 MED ORDER — SERTRALINE HCL 50 MG PO TABS: ORAL_TABLET | ORAL | 1 refills | Status: DC

## 2020-01-22 NOTE — Assessment & Plan Note (Signed)
No recurrence.  They have deferred further evaluation for this at this time given her mobility issues.  If she has recurrence they will let us or the urology office know.

## 2020-01-22 NOTE — Assessment & Plan Note (Signed)
Patient has recovered well.  Does still have some mild dyspnea on exertion which could be related to deconditioning and obesity.  She will continue Lasix every other day at this time.  We will arrange lab work and physical therapy through home health.  She will remain on 2 L nasal cannula for the moment.  If she has any worsening symptoms she will seek medical attention.

## 2020-01-22 NOTE — Telephone Encounter (Signed)
I called and LVM for the verbal orders for PT and home health to be discharged.  Quaron Delacruz,cma

## 2020-01-22 NOTE — Telephone Encounter (Signed)
Please contact the patient's daughter-in-law at [phone]. The patient gave verbal authorization during her visit to do this. We are going to have the patient decrease her Paxil to 15 mg once daily for 2 weeks. And we will start on Zoloft 25 mg once daily for 2 weeks at the same time. After 2 weeks she can discontinue the Paxil and then she will increase the Zoloft to 50 mg. If she has any increasing depression, anxiety, jitteriness, or any other odd symptoms and need to let us know.

## 2020-01-22 NOTE — Telephone Encounter (Signed)
-----   Message from Lourena Simmonds, RPH-CPP sent at 01/22/2020  9:09 AM EDT ----- Kathleen Barton,   Decrease to paxil 15 mg (can split tablet) x 2 weeks overlapping w/ zoloft 25 mg, then stop paxil and increase zoloft to 50 mg daily.   I'm recommending a more prolonged (2 week instead of 1) cross taper d/t short half life of paxil and risk of withdrawal side effects Kathleen Barton ----- Message ----- From: Glori Luis, MD Sent: 01/22/2020   9:03 AM EDT To: Lourena Simmonds, RPH-CPP  Hey Kathleen Barton,   This patient has been on paxil for years. I want to transition her to zoloft for her depression. What would be the best tapering regimen with the paxil to transition to the zoloft? Thanks.   Kathleen Barton

## 2020-01-22 NOTE — Telephone Encounter (Signed)
I called and spoke with the patient's daughter-in-law Amy and informed her of the change from paxil to Zoloft and she understood the instructions given for the change.  Natania Finigan,cma

## 2020-01-22 NOTE — Assessment & Plan Note (Addendum)
Patient does note depression.  Also some memory issues which are likely related to depression.  She is been on Paxil for quite some time.  We will discuss with our clinical pharmacist on transitioning Paxil to Zoloft and we will let the patient's daughter-in-law know.  The patient confirmed that we could contact her daughter-in-law regarding this at [phone].

## 2020-01-23 ENCOUNTER — Telehealth: Payer: Self-pay | Admitting: Family Medicine

## 2020-01-23 MED ORDER — POTASSIUM CHLORIDE CRYS ER 20 MEQ PO TBCR: 20.0000 meq | EXTENDED_RELEASE_TABLET | Freq: Every day | ORAL | 0 refills | Status: DC

## 2020-01-23 MED ORDER — FUROSEMIDE 40 MG PO TABS: 40.0000 mg | ORAL_TABLET | Freq: Every day | ORAL | 0 refills | Status: DC

## 2020-01-23 NOTE — Telephone Encounter (Signed)
Roni, from [facility] [employer], [phone]. Roni would like to know if Dr. Birdie Sons has any concerns about the patient.

## 2020-01-23 NOTE — Telephone Encounter (Signed)
Patient had a visit with provider.  Britanee Vanblarcom,cma  

## 2020-01-23 NOTE — Telephone Encounter (Signed)
I do not have any significant concerns about this patient. She does not always follow-up consistently though generally comes in at least once a year. She seems to follow recommendations regarding her medical problems. Is there a specific concern on their part.

## 2020-01-23 NOTE — Telephone Encounter (Signed)
Roni, from [facility] [employer], [phone]. Roni would like to know if Dr. Birdie Sons has any concerns about the patient.  Javaris Wigington,cma

## 2020-01-23 NOTE — Telephone Encounter (Signed)
Called roni of protective services and her mailbox was full.  Jasdeep Kepner,cma

## 2020-01-28 DIAGNOSIS — R319 Hematuria, unspecified: Secondary | ICD-10-CM | POA: Diagnosis not present

## 2020-01-28 DIAGNOSIS — I509 Heart failure, unspecified: Secondary | ICD-10-CM | POA: Diagnosis not present

## 2020-01-28 DIAGNOSIS — I110 Hypertensive heart disease with heart failure: Secondary | ICD-10-CM | POA: Diagnosis not present

## 2020-01-28 DIAGNOSIS — F32A Depression, unspecified: Secondary | ICD-10-CM | POA: Diagnosis not present

## 2020-01-28 DIAGNOSIS — G3184 Mild cognitive impairment, so stated: Secondary | ICD-10-CM | POA: Diagnosis not present

## 2020-01-28 DIAGNOSIS — Z7901 Long term (current) use of anticoagulants: Secondary | ICD-10-CM | POA: Diagnosis not present

## 2020-01-28 DIAGNOSIS — I5031 Acute diastolic (congestive) heart failure: Secondary | ICD-10-CM | POA: Diagnosis not present

## 2020-01-28 DIAGNOSIS — Z9981 Dependence on supplemental oxygen: Secondary | ICD-10-CM | POA: Diagnosis not present

## 2020-01-29 DIAGNOSIS — Z9181 History of falling: Secondary | ICD-10-CM

## 2020-01-29 DIAGNOSIS — I5031 Acute diastolic (congestive) heart failure: Secondary | ICD-10-CM | POA: Diagnosis not present

## 2020-01-29 DIAGNOSIS — J9621 Acute and chronic respiratory failure with hypoxia: Secondary | ICD-10-CM | POA: Diagnosis not present

## 2020-01-29 DIAGNOSIS — N1831 Chronic kidney disease, stage 3a: Secondary | ICD-10-CM | POA: Diagnosis not present

## 2020-01-29 DIAGNOSIS — Z7901 Long term (current) use of anticoagulants: Secondary | ICD-10-CM

## 2020-01-29 DIAGNOSIS — I130 Hypertensive heart and chronic kidney disease with heart failure and stage 1 through stage 4 chronic kidney disease, or unspecified chronic kidney disease: Secondary | ICD-10-CM | POA: Diagnosis not present

## 2020-01-29 DIAGNOSIS — J9620 Acute and chronic respiratory failure, unspecified whether with hypoxia or hypercapnia: Secondary | ICD-10-CM | POA: Diagnosis not present

## 2020-01-29 DIAGNOSIS — Z86718 Personal history of other venous thrombosis and embolism: Secondary | ICD-10-CM

## 2020-01-29 DIAGNOSIS — G9341 Metabolic encephalopathy: Secondary | ICD-10-CM

## 2020-01-29 DIAGNOSIS — F329 Major depressive disorder, single episode, unspecified: Secondary | ICD-10-CM

## 2020-01-29 DIAGNOSIS — Z6841 Body Mass Index (BMI) 40.0 and over, adult: Secondary | ICD-10-CM

## 2020-01-30 DIAGNOSIS — Z9981 Dependence on supplemental oxygen: Secondary | ICD-10-CM | POA: Diagnosis not present

## 2020-01-30 DIAGNOSIS — Z7901 Long term (current) use of anticoagulants: Secondary | ICD-10-CM | POA: Diagnosis not present

## 2020-01-30 DIAGNOSIS — I110 Hypertensive heart disease with heart failure: Secondary | ICD-10-CM | POA: Diagnosis not present

## 2020-01-30 DIAGNOSIS — F32A Depression, unspecified: Secondary | ICD-10-CM | POA: Diagnosis not present

## 2020-01-30 DIAGNOSIS — R319 Hematuria, unspecified: Secondary | ICD-10-CM | POA: Diagnosis not present

## 2020-01-30 DIAGNOSIS — I5031 Acute diastolic (congestive) heart failure: Secondary | ICD-10-CM | POA: Diagnosis not present

## 2020-01-30 DIAGNOSIS — I509 Heart failure, unspecified: Secondary | ICD-10-CM | POA: Diagnosis not present

## 2020-01-30 DIAGNOSIS — G3184 Mild cognitive impairment, so stated: Secondary | ICD-10-CM | POA: Diagnosis not present

## 2020-02-01 DIAGNOSIS — G3184 Mild cognitive impairment, so stated: Secondary | ICD-10-CM | POA: Diagnosis not present

## 2020-02-01 DIAGNOSIS — F32A Depression, unspecified: Secondary | ICD-10-CM | POA: Diagnosis not present

## 2020-02-01 DIAGNOSIS — I509 Heart failure, unspecified: Secondary | ICD-10-CM | POA: Diagnosis not present

## 2020-02-01 DIAGNOSIS — I110 Hypertensive heart disease with heart failure: Secondary | ICD-10-CM | POA: Diagnosis not present

## 2020-02-01 DIAGNOSIS — I5031 Acute diastolic (congestive) heart failure: Secondary | ICD-10-CM | POA: Diagnosis not present

## 2020-02-01 DIAGNOSIS — Z9981 Dependence on supplemental oxygen: Secondary | ICD-10-CM | POA: Diagnosis not present

## 2020-02-01 DIAGNOSIS — R319 Hematuria, unspecified: Secondary | ICD-10-CM | POA: Diagnosis not present

## 2020-02-01 DIAGNOSIS — Z7901 Long term (current) use of anticoagulants: Secondary | ICD-10-CM | POA: Diagnosis not present

## 2020-02-02 DIAGNOSIS — Z7901 Long term (current) use of anticoagulants: Secondary | ICD-10-CM | POA: Diagnosis not present

## 2020-02-02 DIAGNOSIS — I110 Hypertensive heart disease with heart failure: Secondary | ICD-10-CM | POA: Diagnosis not present

## 2020-02-02 DIAGNOSIS — I509 Heart failure, unspecified: Secondary | ICD-10-CM | POA: Diagnosis not present

## 2020-02-02 DIAGNOSIS — G3184 Mild cognitive impairment, so stated: Secondary | ICD-10-CM | POA: Diagnosis not present

## 2020-02-02 DIAGNOSIS — I5031 Acute diastolic (congestive) heart failure: Secondary | ICD-10-CM | POA: Diagnosis not present

## 2020-02-02 DIAGNOSIS — F32A Depression, unspecified: Secondary | ICD-10-CM | POA: Diagnosis not present

## 2020-02-02 DIAGNOSIS — R319 Hematuria, unspecified: Secondary | ICD-10-CM | POA: Diagnosis not present

## 2020-02-02 DIAGNOSIS — Z9981 Dependence on supplemental oxygen: Secondary | ICD-10-CM | POA: Diagnosis not present

## 2020-02-03 DIAGNOSIS — Z7901 Long term (current) use of anticoagulants: Secondary | ICD-10-CM | POA: Diagnosis not present

## 2020-02-03 DIAGNOSIS — F32A Depression, unspecified: Secondary | ICD-10-CM | POA: Diagnosis not present

## 2020-02-03 DIAGNOSIS — I110 Hypertensive heart disease with heart failure: Secondary | ICD-10-CM | POA: Diagnosis not present

## 2020-02-03 DIAGNOSIS — R319 Hematuria, unspecified: Secondary | ICD-10-CM | POA: Diagnosis not present

## 2020-02-03 DIAGNOSIS — I509 Heart failure, unspecified: Secondary | ICD-10-CM | POA: Diagnosis not present

## 2020-02-03 DIAGNOSIS — G3184 Mild cognitive impairment, so stated: Secondary | ICD-10-CM | POA: Diagnosis not present

## 2020-02-03 DIAGNOSIS — Z9981 Dependence on supplemental oxygen: Secondary | ICD-10-CM | POA: Diagnosis not present

## 2020-02-03 DIAGNOSIS — I5031 Acute diastolic (congestive) heart failure: Secondary | ICD-10-CM | POA: Diagnosis not present

## 2020-02-03 NOTE — Telephone Encounter (Signed)
I called and spoke with Roni and informed her that the provider did not have any concerns with the patient  and she stated she just wanted an update on the patient that was all.

## 2020-02-04 ENCOUNTER — Other Ambulatory Visit: Payer: Self-pay | Admitting: Internal Medicine

## 2020-02-04 DIAGNOSIS — I110 Hypertensive heart disease with heart failure: Secondary | ICD-10-CM | POA: Diagnosis not present

## 2020-02-04 DIAGNOSIS — Z7901 Long term (current) use of anticoagulants: Secondary | ICD-10-CM | POA: Diagnosis not present

## 2020-02-04 DIAGNOSIS — I5031 Acute diastolic (congestive) heart failure: Secondary | ICD-10-CM | POA: Diagnosis not present

## 2020-02-04 DIAGNOSIS — R319 Hematuria, unspecified: Secondary | ICD-10-CM | POA: Diagnosis not present

## 2020-02-04 DIAGNOSIS — F32A Depression, unspecified: Secondary | ICD-10-CM | POA: Diagnosis not present

## 2020-02-04 DIAGNOSIS — G3184 Mild cognitive impairment, so stated: Secondary | ICD-10-CM | POA: Diagnosis not present

## 2020-02-04 DIAGNOSIS — Z9981 Dependence on supplemental oxygen: Secondary | ICD-10-CM | POA: Diagnosis not present

## 2020-02-04 DIAGNOSIS — I509 Heart failure, unspecified: Secondary | ICD-10-CM | POA: Diagnosis not present

## 2020-02-05 DIAGNOSIS — R319 Hematuria, unspecified: Secondary | ICD-10-CM | POA: Diagnosis not present

## 2020-02-05 DIAGNOSIS — I110 Hypertensive heart disease with heart failure: Secondary | ICD-10-CM | POA: Diagnosis not present

## 2020-02-05 DIAGNOSIS — I5031 Acute diastolic (congestive) heart failure: Secondary | ICD-10-CM | POA: Diagnosis not present

## 2020-02-05 DIAGNOSIS — G3184 Mild cognitive impairment, so stated: Secondary | ICD-10-CM | POA: Diagnosis not present

## 2020-02-05 DIAGNOSIS — Z7901 Long term (current) use of anticoagulants: Secondary | ICD-10-CM | POA: Diagnosis not present

## 2020-02-05 DIAGNOSIS — Z9981 Dependence on supplemental oxygen: Secondary | ICD-10-CM | POA: Diagnosis not present

## 2020-02-05 DIAGNOSIS — F32A Depression, unspecified: Secondary | ICD-10-CM | POA: Diagnosis not present

## 2020-02-05 DIAGNOSIS — I509 Heart failure, unspecified: Secondary | ICD-10-CM | POA: Diagnosis not present

## 2020-02-05 LAB — BASIC METABOLIC PANEL
BUN: 16 (ref 4–21)
CO2: 34 — AB (ref 13–22)
Chloride: 99 (ref 99–108)
Creatinine: 0.7 (ref 0.5–1.1)
Glucose: 114
Potassium: 4.2 (ref 3.4–5.3)
Sodium: 143 (ref 137–147)

## 2020-02-05 LAB — VITAMIN B12: Vitamin B-12: 701

## 2020-02-05 LAB — TSH: TSH: 0.92 (ref <=5.90)

## 2020-02-05 LAB — COMPREHENSIVE METABOLIC PANEL
Calcium: 0.9 — AB (ref 8.7–10.7)
GFR calc Af Amer: 92
GFR calc non Af Amer: 79

## 2020-02-09 DIAGNOSIS — R319 Hematuria, unspecified: Secondary | ICD-10-CM | POA: Diagnosis not present

## 2020-02-09 DIAGNOSIS — F32A Depression, unspecified: Secondary | ICD-10-CM | POA: Diagnosis not present

## 2020-02-09 DIAGNOSIS — I509 Heart failure, unspecified: Secondary | ICD-10-CM | POA: Diagnosis not present

## 2020-02-09 DIAGNOSIS — I5031 Acute diastolic (congestive) heart failure: Secondary | ICD-10-CM | POA: Diagnosis not present

## 2020-02-09 DIAGNOSIS — I110 Hypertensive heart disease with heart failure: Secondary | ICD-10-CM | POA: Diagnosis not present

## 2020-02-09 DIAGNOSIS — G3184 Mild cognitive impairment, so stated: Secondary | ICD-10-CM | POA: Diagnosis not present

## 2020-02-09 DIAGNOSIS — Z9981 Dependence on supplemental oxygen: Secondary | ICD-10-CM | POA: Diagnosis not present

## 2020-02-09 DIAGNOSIS — Z7901 Long term (current) use of anticoagulants: Secondary | ICD-10-CM | POA: Diagnosis not present

## 2020-02-10 DIAGNOSIS — I110 Hypertensive heart disease with heart failure: Secondary | ICD-10-CM | POA: Diagnosis not present

## 2020-02-10 DIAGNOSIS — F32A Depression, unspecified: Secondary | ICD-10-CM | POA: Diagnosis not present

## 2020-02-10 DIAGNOSIS — I509 Heart failure, unspecified: Secondary | ICD-10-CM | POA: Diagnosis not present

## 2020-02-10 DIAGNOSIS — R319 Hematuria, unspecified: Secondary | ICD-10-CM | POA: Diagnosis not present

## 2020-02-10 DIAGNOSIS — G3184 Mild cognitive impairment, so stated: Secondary | ICD-10-CM | POA: Diagnosis not present

## 2020-02-10 DIAGNOSIS — Z9981 Dependence on supplemental oxygen: Secondary | ICD-10-CM | POA: Diagnosis not present

## 2020-02-10 DIAGNOSIS — I5031 Acute diastolic (congestive) heart failure: Secondary | ICD-10-CM | POA: Diagnosis not present

## 2020-02-10 DIAGNOSIS — Z7901 Long term (current) use of anticoagulants: Secondary | ICD-10-CM | POA: Diagnosis not present

## 2020-02-11 DIAGNOSIS — G3184 Mild cognitive impairment, so stated: Secondary | ICD-10-CM | POA: Diagnosis not present

## 2020-02-11 DIAGNOSIS — I110 Hypertensive heart disease with heart failure: Secondary | ICD-10-CM | POA: Diagnosis not present

## 2020-02-11 DIAGNOSIS — I509 Heart failure, unspecified: Secondary | ICD-10-CM | POA: Diagnosis not present

## 2020-02-11 DIAGNOSIS — R319 Hematuria, unspecified: Secondary | ICD-10-CM | POA: Diagnosis not present

## 2020-02-11 DIAGNOSIS — Z9981 Dependence on supplemental oxygen: Secondary | ICD-10-CM | POA: Diagnosis not present

## 2020-02-11 DIAGNOSIS — Z7901 Long term (current) use of anticoagulants: Secondary | ICD-10-CM | POA: Diagnosis not present

## 2020-02-11 DIAGNOSIS — I5031 Acute diastolic (congestive) heart failure: Secondary | ICD-10-CM | POA: Diagnosis not present

## 2020-02-11 DIAGNOSIS — F32A Depression, unspecified: Secondary | ICD-10-CM | POA: Diagnosis not present

## 2020-02-13 DIAGNOSIS — G3184 Mild cognitive impairment, so stated: Secondary | ICD-10-CM | POA: Diagnosis not present

## 2020-02-13 DIAGNOSIS — Z9981 Dependence on supplemental oxygen: Secondary | ICD-10-CM | POA: Diagnosis not present

## 2020-02-13 DIAGNOSIS — I509 Heart failure, unspecified: Secondary | ICD-10-CM | POA: Diagnosis not present

## 2020-02-13 DIAGNOSIS — I5031 Acute diastolic (congestive) heart failure: Secondary | ICD-10-CM | POA: Diagnosis not present

## 2020-02-13 DIAGNOSIS — R319 Hematuria, unspecified: Secondary | ICD-10-CM | POA: Diagnosis not present

## 2020-02-13 DIAGNOSIS — F32A Depression, unspecified: Secondary | ICD-10-CM | POA: Diagnosis not present

## 2020-02-13 DIAGNOSIS — Z7901 Long term (current) use of anticoagulants: Secondary | ICD-10-CM | POA: Diagnosis not present

## 2020-02-13 DIAGNOSIS — I110 Hypertensive heart disease with heart failure: Secondary | ICD-10-CM | POA: Diagnosis not present

## 2020-02-16 DIAGNOSIS — Z9981 Dependence on supplemental oxygen: Secondary | ICD-10-CM | POA: Diagnosis not present

## 2020-02-16 DIAGNOSIS — G3184 Mild cognitive impairment, so stated: Secondary | ICD-10-CM | POA: Diagnosis not present

## 2020-02-16 DIAGNOSIS — I509 Heart failure, unspecified: Secondary | ICD-10-CM | POA: Diagnosis not present

## 2020-02-16 DIAGNOSIS — I110 Hypertensive heart disease with heart failure: Secondary | ICD-10-CM | POA: Diagnosis not present

## 2020-02-16 DIAGNOSIS — I5031 Acute diastolic (congestive) heart failure: Secondary | ICD-10-CM | POA: Diagnosis not present

## 2020-02-16 DIAGNOSIS — R319 Hematuria, unspecified: Secondary | ICD-10-CM | POA: Diagnosis not present

## 2020-02-16 DIAGNOSIS — Z7901 Long term (current) use of anticoagulants: Secondary | ICD-10-CM | POA: Diagnosis not present

## 2020-02-16 DIAGNOSIS — F32A Depression, unspecified: Secondary | ICD-10-CM | POA: Diagnosis not present

## 2020-02-17 DIAGNOSIS — Z9981 Dependence on supplemental oxygen: Secondary | ICD-10-CM | POA: Diagnosis not present

## 2020-02-17 DIAGNOSIS — I509 Heart failure, unspecified: Secondary | ICD-10-CM | POA: Diagnosis not present

## 2020-02-17 DIAGNOSIS — F32A Depression, unspecified: Secondary | ICD-10-CM | POA: Diagnosis not present

## 2020-02-17 DIAGNOSIS — I110 Hypertensive heart disease with heart failure: Secondary | ICD-10-CM | POA: Diagnosis not present

## 2020-02-17 DIAGNOSIS — Z7901 Long term (current) use of anticoagulants: Secondary | ICD-10-CM | POA: Diagnosis not present

## 2020-02-17 DIAGNOSIS — R319 Hematuria, unspecified: Secondary | ICD-10-CM | POA: Diagnosis not present

## 2020-02-17 DIAGNOSIS — I5031 Acute diastolic (congestive) heart failure: Secondary | ICD-10-CM | POA: Diagnosis not present

## 2020-02-17 DIAGNOSIS — G3184 Mild cognitive impairment, so stated: Secondary | ICD-10-CM | POA: Diagnosis not present

## 2020-02-18 DIAGNOSIS — I5033 Acute on chronic diastolic (congestive) heart failure: Secondary | ICD-10-CM | POA: Diagnosis not present

## 2020-02-18 DIAGNOSIS — F32A Depression, unspecified: Secondary | ICD-10-CM | POA: Diagnosis not present

## 2020-02-18 DIAGNOSIS — I5031 Acute diastolic (congestive) heart failure: Secondary | ICD-10-CM | POA: Diagnosis not present

## 2020-02-18 DIAGNOSIS — I110 Hypertensive heart disease with heart failure: Secondary | ICD-10-CM | POA: Diagnosis not present

## 2020-02-18 DIAGNOSIS — R319 Hematuria, unspecified: Secondary | ICD-10-CM | POA: Diagnosis not present

## 2020-02-18 DIAGNOSIS — G9341 Metabolic encephalopathy: Secondary | ICD-10-CM | POA: Diagnosis not present

## 2020-02-18 DIAGNOSIS — J9620 Acute and chronic respiratory failure, unspecified whether with hypoxia or hypercapnia: Secondary | ICD-10-CM | POA: Diagnosis not present

## 2020-02-18 DIAGNOSIS — Z9981 Dependence on supplemental oxygen: Secondary | ICD-10-CM | POA: Diagnosis not present

## 2020-02-18 DIAGNOSIS — J9601 Acute respiratory failure with hypoxia: Secondary | ICD-10-CM | POA: Diagnosis not present

## 2020-02-18 DIAGNOSIS — G3184 Mild cognitive impairment, so stated: Secondary | ICD-10-CM | POA: Diagnosis not present

## 2020-02-18 DIAGNOSIS — I509 Heart failure, unspecified: Secondary | ICD-10-CM | POA: Diagnosis not present

## 2020-02-18 DIAGNOSIS — Z7901 Long term (current) use of anticoagulants: Secondary | ICD-10-CM | POA: Diagnosis not present

## 2020-02-23 DIAGNOSIS — I5031 Acute diastolic (congestive) heart failure: Secondary | ICD-10-CM | POA: Diagnosis not present

## 2020-02-23 DIAGNOSIS — I509 Heart failure, unspecified: Secondary | ICD-10-CM | POA: Diagnosis not present

## 2020-02-23 DIAGNOSIS — Z9981 Dependence on supplemental oxygen: Secondary | ICD-10-CM | POA: Diagnosis not present

## 2020-02-23 DIAGNOSIS — G3184 Mild cognitive impairment, so stated: Secondary | ICD-10-CM | POA: Diagnosis not present

## 2020-02-23 DIAGNOSIS — I110 Hypertensive heart disease with heart failure: Secondary | ICD-10-CM | POA: Diagnosis not present

## 2020-02-23 DIAGNOSIS — Z7901 Long term (current) use of anticoagulants: Secondary | ICD-10-CM | POA: Diagnosis not present

## 2020-02-23 DIAGNOSIS — R319 Hematuria, unspecified: Secondary | ICD-10-CM | POA: Diagnosis not present

## 2020-02-23 DIAGNOSIS — F32A Depression, unspecified: Secondary | ICD-10-CM | POA: Diagnosis not present

## 2020-02-24 DIAGNOSIS — I5031 Acute diastolic (congestive) heart failure: Secondary | ICD-10-CM | POA: Diagnosis not present

## 2020-02-24 DIAGNOSIS — G3184 Mild cognitive impairment, so stated: Secondary | ICD-10-CM | POA: Diagnosis not present

## 2020-02-24 DIAGNOSIS — I509 Heart failure, unspecified: Secondary | ICD-10-CM | POA: Diagnosis not present

## 2020-02-24 DIAGNOSIS — Z7901 Long term (current) use of anticoagulants: Secondary | ICD-10-CM | POA: Diagnosis not present

## 2020-02-24 DIAGNOSIS — I110 Hypertensive heart disease with heart failure: Secondary | ICD-10-CM | POA: Diagnosis not present

## 2020-02-24 DIAGNOSIS — F32A Depression, unspecified: Secondary | ICD-10-CM | POA: Diagnosis not present

## 2020-02-24 DIAGNOSIS — R319 Hematuria, unspecified: Secondary | ICD-10-CM | POA: Diagnosis not present

## 2020-02-24 DIAGNOSIS — Z9981 Dependence on supplemental oxygen: Secondary | ICD-10-CM | POA: Diagnosis not present

## 2020-03-03 ENCOUNTER — Other Ambulatory Visit: Payer: Self-pay

## 2020-03-03 DIAGNOSIS — R319 Hematuria, unspecified: Secondary | ICD-10-CM | POA: Diagnosis not present

## 2020-03-03 DIAGNOSIS — I509 Heart failure, unspecified: Secondary | ICD-10-CM | POA: Diagnosis not present

## 2020-03-03 DIAGNOSIS — Z7901 Long term (current) use of anticoagulants: Secondary | ICD-10-CM | POA: Diagnosis not present

## 2020-03-03 DIAGNOSIS — F32A Depression, unspecified: Secondary | ICD-10-CM | POA: Diagnosis not present

## 2020-03-03 DIAGNOSIS — I5031 Acute diastolic (congestive) heart failure: Secondary | ICD-10-CM | POA: Diagnosis not present

## 2020-03-03 DIAGNOSIS — I10 Essential (primary) hypertension: Secondary | ICD-10-CM

## 2020-03-03 DIAGNOSIS — Z9981 Dependence on supplemental oxygen: Secondary | ICD-10-CM | POA: Diagnosis not present

## 2020-03-03 DIAGNOSIS — G3184 Mild cognitive impairment, so stated: Secondary | ICD-10-CM | POA: Diagnosis not present

## 2020-03-03 DIAGNOSIS — I110 Hypertensive heart disease with heart failure: Secondary | ICD-10-CM | POA: Diagnosis not present

## 2020-03-04 ENCOUNTER — Telehealth: Payer: Self-pay | Admitting: Family Medicine

## 2020-03-04 DIAGNOSIS — I10 Essential (primary) hypertension: Secondary | ICD-10-CM

## 2020-03-04 MED ORDER — POTASSIUM CHLORIDE CRYS ER 20 MEQ PO TBCR: 20.0000 meq | EXTENDED_RELEASE_TABLET | Freq: Every day | ORAL | 0 refills | Status: DC

## 2020-03-04 MED ORDER — METOPROLOL SUCCINATE ER 50 MG PO TB24
ORAL_TABLET | ORAL | 0 refills | Status: DC
Start: 2020-03-04 — End: 2020-04-02

## 2020-03-04 MED ORDER — FUROSEMIDE 40 MG PO TABS: 40.0000 mg | ORAL_TABLET | Freq: Every day | ORAL | 0 refills | Status: DC

## 2020-03-04 MED ORDER — METOPROLOL SUCCINATE ER 50 MG PO TB24
ORAL_TABLET | ORAL | 0 refills | Status: DC
Start: 2020-03-04 — End: 2020-03-04

## 2020-03-04 NOTE — Telephone Encounter (Signed)
Patient called in need refill on medication but did not have the names for medication she stated that the pharmacy had faxed it in .

## 2020-03-04 NOTE — Addendum Note (Signed)
Addended by: Elise Benne T on: 03/04/2020 11:34 AM   Modules accepted: Orders

## 2020-03-04 NOTE — Telephone Encounter (Signed)
I called and spoke with the patient's daughter and informed her that 3 medication refills were sent to the pharmacy today.  She understood.  Cailen Texeira,cma

## 2020-03-04 NOTE — Telephone Encounter (Signed)
Patient called in for refill on potassium chloride SA (KLOR-CON) 20 MEQ tablet ,metoprolol succinate (TOPROL-XL) 50 MG 24 hr tablet   ,furosemide (LASIX) 40 MG tablet

## 2020-03-08 DIAGNOSIS — I110 Hypertensive heart disease with heart failure: Secondary | ICD-10-CM | POA: Diagnosis not present

## 2020-03-08 DIAGNOSIS — I5031 Acute diastolic (congestive) heart failure: Secondary | ICD-10-CM | POA: Diagnosis not present

## 2020-03-08 DIAGNOSIS — G3184 Mild cognitive impairment, so stated: Secondary | ICD-10-CM | POA: Diagnosis not present

## 2020-03-08 DIAGNOSIS — R319 Hematuria, unspecified: Secondary | ICD-10-CM | POA: Diagnosis not present

## 2020-03-08 DIAGNOSIS — Z7901 Long term (current) use of anticoagulants: Secondary | ICD-10-CM | POA: Diagnosis not present

## 2020-03-08 DIAGNOSIS — Z9981 Dependence on supplemental oxygen: Secondary | ICD-10-CM | POA: Diagnosis not present

## 2020-03-08 DIAGNOSIS — I509 Heart failure, unspecified: Secondary | ICD-10-CM | POA: Diagnosis not present

## 2020-03-08 DIAGNOSIS — F32A Depression, unspecified: Secondary | ICD-10-CM | POA: Diagnosis not present

## 2020-03-15 DIAGNOSIS — F32A Depression, unspecified: Secondary | ICD-10-CM | POA: Diagnosis not present

## 2020-03-15 DIAGNOSIS — G3184 Mild cognitive impairment, so stated: Secondary | ICD-10-CM | POA: Diagnosis not present

## 2020-03-15 DIAGNOSIS — I509 Heart failure, unspecified: Secondary | ICD-10-CM | POA: Diagnosis not present

## 2020-03-15 DIAGNOSIS — Z9981 Dependence on supplemental oxygen: Secondary | ICD-10-CM | POA: Diagnosis not present

## 2020-03-15 DIAGNOSIS — I110 Hypertensive heart disease with heart failure: Secondary | ICD-10-CM | POA: Diagnosis not present

## 2020-03-15 DIAGNOSIS — Z7901 Long term (current) use of anticoagulants: Secondary | ICD-10-CM | POA: Diagnosis not present

## 2020-03-15 DIAGNOSIS — R319 Hematuria, unspecified: Secondary | ICD-10-CM | POA: Diagnosis not present

## 2020-03-15 DIAGNOSIS — I5031 Acute diastolic (congestive) heart failure: Secondary | ICD-10-CM | POA: Diagnosis not present

## 2020-03-16 DIAGNOSIS — Z9981 Dependence on supplemental oxygen: Secondary | ICD-10-CM | POA: Diagnosis not present

## 2020-03-16 DIAGNOSIS — F32A Depression, unspecified: Secondary | ICD-10-CM | POA: Diagnosis not present

## 2020-03-16 DIAGNOSIS — R319 Hematuria, unspecified: Secondary | ICD-10-CM | POA: Diagnosis not present

## 2020-03-16 DIAGNOSIS — I509 Heart failure, unspecified: Secondary | ICD-10-CM | POA: Diagnosis not present

## 2020-03-16 DIAGNOSIS — G3184 Mild cognitive impairment, so stated: Secondary | ICD-10-CM | POA: Diagnosis not present

## 2020-03-16 DIAGNOSIS — Z7901 Long term (current) use of anticoagulants: Secondary | ICD-10-CM | POA: Diagnosis not present

## 2020-03-16 DIAGNOSIS — I5031 Acute diastolic (congestive) heart failure: Secondary | ICD-10-CM | POA: Diagnosis not present

## 2020-03-16 DIAGNOSIS — I110 Hypertensive heart disease with heart failure: Secondary | ICD-10-CM | POA: Diagnosis not present

## 2020-03-20 DIAGNOSIS — G9341 Metabolic encephalopathy: Secondary | ICD-10-CM | POA: Diagnosis not present

## 2020-03-20 DIAGNOSIS — I5033 Acute on chronic diastolic (congestive) heart failure: Secondary | ICD-10-CM | POA: Diagnosis not present

## 2020-03-20 DIAGNOSIS — J9620 Acute and chronic respiratory failure, unspecified whether with hypoxia or hypercapnia: Secondary | ICD-10-CM | POA: Diagnosis not present

## 2020-03-20 DIAGNOSIS — U071 COVID-19: Secondary | ICD-10-CM | POA: Diagnosis not present

## 2020-03-20 DIAGNOSIS — J9601 Acute respiratory failure with hypoxia: Secondary | ICD-10-CM | POA: Diagnosis not present

## 2020-03-23 DIAGNOSIS — I509 Heart failure, unspecified: Secondary | ICD-10-CM | POA: Diagnosis not present

## 2020-03-23 DIAGNOSIS — G3184 Mild cognitive impairment, so stated: Secondary | ICD-10-CM | POA: Diagnosis not present

## 2020-03-23 DIAGNOSIS — Z7901 Long term (current) use of anticoagulants: Secondary | ICD-10-CM | POA: Diagnosis not present

## 2020-03-23 DIAGNOSIS — I110 Hypertensive heart disease with heart failure: Secondary | ICD-10-CM | POA: Diagnosis not present

## 2020-03-23 DIAGNOSIS — R319 Hematuria, unspecified: Secondary | ICD-10-CM | POA: Diagnosis not present

## 2020-03-23 DIAGNOSIS — Z9981 Dependence on supplemental oxygen: Secondary | ICD-10-CM | POA: Diagnosis not present

## 2020-03-23 DIAGNOSIS — I5031 Acute diastolic (congestive) heart failure: Secondary | ICD-10-CM | POA: Diagnosis not present

## 2020-03-23 DIAGNOSIS — F32A Depression, unspecified: Secondary | ICD-10-CM | POA: Diagnosis not present

## 2020-03-24 DIAGNOSIS — I509 Heart failure, unspecified: Secondary | ICD-10-CM | POA: Diagnosis not present

## 2020-03-24 DIAGNOSIS — I5031 Acute diastolic (congestive) heart failure: Secondary | ICD-10-CM | POA: Diagnosis not present

## 2020-03-24 DIAGNOSIS — I110 Hypertensive heart disease with heart failure: Secondary | ICD-10-CM | POA: Diagnosis not present

## 2020-03-24 DIAGNOSIS — F32A Depression, unspecified: Secondary | ICD-10-CM | POA: Diagnosis not present

## 2020-03-24 DIAGNOSIS — Z9981 Dependence on supplemental oxygen: Secondary | ICD-10-CM | POA: Diagnosis not present

## 2020-03-24 DIAGNOSIS — Z7901 Long term (current) use of anticoagulants: Secondary | ICD-10-CM | POA: Diagnosis not present

## 2020-03-24 DIAGNOSIS — R319 Hematuria, unspecified: Secondary | ICD-10-CM | POA: Diagnosis not present

## 2020-03-24 DIAGNOSIS — G3184 Mild cognitive impairment, so stated: Secondary | ICD-10-CM | POA: Diagnosis not present

## 2020-03-25 DIAGNOSIS — I509 Heart failure, unspecified: Secondary | ICD-10-CM | POA: Diagnosis not present

## 2020-03-25 DIAGNOSIS — Z7901 Long term (current) use of anticoagulants: Secondary | ICD-10-CM | POA: Diagnosis not present

## 2020-03-25 DIAGNOSIS — I5031 Acute diastolic (congestive) heart failure: Secondary | ICD-10-CM | POA: Diagnosis not present

## 2020-03-25 DIAGNOSIS — Z9981 Dependence on supplemental oxygen: Secondary | ICD-10-CM | POA: Diagnosis not present

## 2020-03-25 DIAGNOSIS — I110 Hypertensive heart disease with heart failure: Secondary | ICD-10-CM | POA: Diagnosis not present

## 2020-03-25 DIAGNOSIS — R319 Hematuria, unspecified: Secondary | ICD-10-CM | POA: Diagnosis not present

## 2020-03-25 DIAGNOSIS — G3184 Mild cognitive impairment, so stated: Secondary | ICD-10-CM | POA: Diagnosis not present

## 2020-03-25 DIAGNOSIS — F32A Depression, unspecified: Secondary | ICD-10-CM | POA: Diagnosis not present

## 2020-03-29 DIAGNOSIS — I5031 Acute diastolic (congestive) heart failure: Secondary | ICD-10-CM | POA: Diagnosis not present

## 2020-03-29 DIAGNOSIS — Z7901 Long term (current) use of anticoagulants: Secondary | ICD-10-CM | POA: Diagnosis not present

## 2020-03-29 DIAGNOSIS — I110 Hypertensive heart disease with heart failure: Secondary | ICD-10-CM | POA: Diagnosis not present

## 2020-03-29 DIAGNOSIS — Z9981 Dependence on supplemental oxygen: Secondary | ICD-10-CM | POA: Diagnosis not present

## 2020-03-29 DIAGNOSIS — R319 Hematuria, unspecified: Secondary | ICD-10-CM | POA: Diagnosis not present

## 2020-03-29 DIAGNOSIS — F32A Depression, unspecified: Secondary | ICD-10-CM | POA: Diagnosis not present

## 2020-03-30 ENCOUNTER — Encounter: Payer: Self-pay | Admitting: Family Medicine

## 2020-03-30 DIAGNOSIS — R319 Hematuria, unspecified: Secondary | ICD-10-CM | POA: Diagnosis not present

## 2020-03-30 DIAGNOSIS — Z9981 Dependence on supplemental oxygen: Secondary | ICD-10-CM | POA: Diagnosis not present

## 2020-03-30 DIAGNOSIS — G4733 Obstructive sleep apnea (adult) (pediatric): Secondary | ICD-10-CM | POA: Diagnosis not present

## 2020-03-30 DIAGNOSIS — Z7901 Long term (current) use of anticoagulants: Secondary | ICD-10-CM | POA: Diagnosis not present

## 2020-03-30 DIAGNOSIS — I5031 Acute diastolic (congestive) heart failure: Secondary | ICD-10-CM | POA: Diagnosis not present

## 2020-03-30 DIAGNOSIS — R0602 Shortness of breath: Secondary | ICD-10-CM | POA: Diagnosis not present

## 2020-03-30 DIAGNOSIS — I110 Hypertensive heart disease with heart failure: Secondary | ICD-10-CM | POA: Diagnosis not present

## 2020-03-30 DIAGNOSIS — F32A Depression, unspecified: Secondary | ICD-10-CM | POA: Diagnosis not present

## 2020-03-31 DIAGNOSIS — R0602 Shortness of breath: Secondary | ICD-10-CM | POA: Diagnosis not present

## 2020-03-31 DIAGNOSIS — G4733 Obstructive sleep apnea (adult) (pediatric): Secondary | ICD-10-CM | POA: Diagnosis not present

## 2020-04-01 ENCOUNTER — Telehealth: Payer: Self-pay | Admitting: Family Medicine

## 2020-04-01 DIAGNOSIS — Z9981 Dependence on supplemental oxygen: Secondary | ICD-10-CM | POA: Diagnosis not present

## 2020-04-01 DIAGNOSIS — Z7901 Long term (current) use of anticoagulants: Secondary | ICD-10-CM | POA: Diagnosis not present

## 2020-04-01 DIAGNOSIS — I110 Hypertensive heart disease with heart failure: Secondary | ICD-10-CM | POA: Diagnosis not present

## 2020-04-01 DIAGNOSIS — I10 Essential (primary) hypertension: Secondary | ICD-10-CM

## 2020-04-01 DIAGNOSIS — I5031 Acute diastolic (congestive) heart failure: Secondary | ICD-10-CM | POA: Diagnosis not present

## 2020-04-01 DIAGNOSIS — F32A Depression, unspecified: Secondary | ICD-10-CM | POA: Diagnosis not present

## 2020-04-01 DIAGNOSIS — R319 Hematuria, unspecified: Secondary | ICD-10-CM | POA: Diagnosis not present

## 2020-04-01 MED ORDER — POTASSIUM CHLORIDE CRYS ER 20 MEQ PO TBCR: 20.0000 meq | EXTENDED_RELEASE_TABLET | Freq: Every day | ORAL | 0 refills | Status: DC

## 2020-04-01 MED ORDER — APIXABAN 5 MG PO TABS
5.0000 mg | ORAL_TABLET | Freq: Two times a day (BID) | ORAL | 1 refills | Status: DC
Start: 2020-04-01 — End: 2020-12-09

## 2020-04-01 MED ORDER — FUROSEMIDE 40 MG PO TABS: 40.0000 mg | ORAL_TABLET | Freq: Every day | ORAL | 0 refills | Status: DC

## 2020-04-01 NOTE — Telephone Encounter (Signed)
Patient called back with the names of medication she needs refilled; apixaban (ELIQUIS) 5 MG TABS tablet and furosemide (LASIX) 40 MG tablet, potassium chloride SA (KLOR-CON) 20 MEQ tablet. Pharmacy is [facility].

## 2020-04-02 ENCOUNTER — Other Ambulatory Visit: Payer: Self-pay

## 2020-04-02 DIAGNOSIS — I5031 Acute diastolic (congestive) heart failure: Secondary | ICD-10-CM

## 2020-04-02 MED ORDER — METOPROLOL SUCCINATE ER 50 MG PO TB24: ORAL_TABLET | ORAL | 0 refills | Status: DC

## 2020-04-02 NOTE — Addendum Note (Signed)
Addended by: Elise Benne T on: 04/02/2020 01:38 PM   Modules accepted: Orders

## 2020-04-02 NOTE — Telephone Encounter (Signed)
[facility] said they did not receive the refill for metoprolol yesterday. She said they have been trying to get this filled from [place] since they refilled it on 11/11.

## 2020-04-05 DIAGNOSIS — Z7901 Long term (current) use of anticoagulants: Secondary | ICD-10-CM | POA: Diagnosis not present

## 2020-04-05 DIAGNOSIS — I5031 Acute diastolic (congestive) heart failure: Secondary | ICD-10-CM | POA: Diagnosis not present

## 2020-04-05 DIAGNOSIS — R319 Hematuria, unspecified: Secondary | ICD-10-CM | POA: Diagnosis not present

## 2020-04-05 DIAGNOSIS — I110 Hypertensive heart disease with heart failure: Secondary | ICD-10-CM | POA: Diagnosis not present

## 2020-04-05 DIAGNOSIS — F32A Depression, unspecified: Secondary | ICD-10-CM | POA: Diagnosis not present

## 2020-04-05 DIAGNOSIS — Z9981 Dependence on supplemental oxygen: Secondary | ICD-10-CM | POA: Diagnosis not present

## 2020-04-06 DIAGNOSIS — Z9981 Dependence on supplemental oxygen: Secondary | ICD-10-CM | POA: Diagnosis not present

## 2020-04-06 DIAGNOSIS — R319 Hematuria, unspecified: Secondary | ICD-10-CM | POA: Diagnosis not present

## 2020-04-06 DIAGNOSIS — I110 Hypertensive heart disease with heart failure: Secondary | ICD-10-CM | POA: Diagnosis not present

## 2020-04-06 DIAGNOSIS — I5031 Acute diastolic (congestive) heart failure: Secondary | ICD-10-CM | POA: Diagnosis not present

## 2020-04-06 DIAGNOSIS — F32A Depression, unspecified: Secondary | ICD-10-CM | POA: Diagnosis not present

## 2020-04-06 DIAGNOSIS — Z7901 Long term (current) use of anticoagulants: Secondary | ICD-10-CM | POA: Diagnosis not present

## 2020-04-08 ENCOUNTER — Telehealth: Payer: Self-pay | Admitting: Family Medicine

## 2020-04-08 DIAGNOSIS — I5031 Acute diastolic (congestive) heart failure: Secondary | ICD-10-CM | POA: Diagnosis not present

## 2020-04-08 DIAGNOSIS — F32A Depression, unspecified: Secondary | ICD-10-CM | POA: Diagnosis not present

## 2020-04-08 DIAGNOSIS — Z789 Other specified health status: Secondary | ICD-10-CM

## 2020-04-08 DIAGNOSIS — Z7901 Long term (current) use of anticoagulants: Secondary | ICD-10-CM | POA: Diagnosis not present

## 2020-04-08 DIAGNOSIS — Z9981 Dependence on supplemental oxygen: Secondary | ICD-10-CM | POA: Diagnosis not present

## 2020-04-08 DIAGNOSIS — I110 Hypertensive heart disease with heart failure: Secondary | ICD-10-CM | POA: Diagnosis not present

## 2020-04-08 DIAGNOSIS — R319 Hematuria, unspecified: Secondary | ICD-10-CM | POA: Diagnosis not present

## 2020-04-08 NOTE — Telephone Encounter (Signed)
Please let the patient know that her sleep study actually revealed that she could have COPD or some other lung issue. I would recommend that she be referred to pulmonology for further evaluation. I can refer her once you speak with her. Thanks.

## 2020-04-08 NOTE — Telephone Encounter (Signed)
I called and spoke with the patient and informed her of her sleep study results. Patient is okay with the referral to pulmonary to further evaluate.  Kathleen Barton,cma

## 2020-04-09 NOTE — Telephone Encounter (Signed)
Pt would like a call back. She didn't say her reason.

## 2020-04-09 NOTE — Telephone Encounter (Signed)
Referral placed.

## 2020-04-09 NOTE — Addendum Note (Signed)
Addended by: Birdie Sons Shalawn Wynder G on: 04/09/2020 12:54 PM   Modules accepted: Orders

## 2020-04-16 DIAGNOSIS — Z7901 Long term (current) use of anticoagulants: Secondary | ICD-10-CM | POA: Diagnosis not present

## 2020-04-16 DIAGNOSIS — I110 Hypertensive heart disease with heart failure: Secondary | ICD-10-CM | POA: Diagnosis not present

## 2020-04-16 DIAGNOSIS — F32A Depression, unspecified: Secondary | ICD-10-CM | POA: Diagnosis not present

## 2020-04-16 DIAGNOSIS — Z9981 Dependence on supplemental oxygen: Secondary | ICD-10-CM | POA: Diagnosis not present

## 2020-04-16 DIAGNOSIS — R319 Hematuria, unspecified: Secondary | ICD-10-CM | POA: Diagnosis not present

## 2020-04-16 DIAGNOSIS — I5031 Acute diastolic (congestive) heart failure: Secondary | ICD-10-CM | POA: Diagnosis not present

## 2020-04-19 DIAGNOSIS — J9620 Acute and chronic respiratory failure, unspecified whether with hypoxia or hypercapnia: Secondary | ICD-10-CM | POA: Diagnosis not present

## 2020-04-19 DIAGNOSIS — U071 COVID-19: Secondary | ICD-10-CM | POA: Diagnosis not present

## 2020-04-19 DIAGNOSIS — J9601 Acute respiratory failure with hypoxia: Secondary | ICD-10-CM | POA: Diagnosis not present

## 2020-04-19 DIAGNOSIS — G9341 Metabolic encephalopathy: Secondary | ICD-10-CM | POA: Diagnosis not present

## 2020-04-19 DIAGNOSIS — I5033 Acute on chronic diastolic (congestive) heart failure: Secondary | ICD-10-CM | POA: Diagnosis not present

## 2020-04-20 DIAGNOSIS — I5031 Acute diastolic (congestive) heart failure: Secondary | ICD-10-CM | POA: Diagnosis not present

## 2020-04-20 DIAGNOSIS — F32A Depression, unspecified: Secondary | ICD-10-CM | POA: Diagnosis not present

## 2020-04-20 DIAGNOSIS — R319 Hematuria, unspecified: Secondary | ICD-10-CM | POA: Diagnosis not present

## 2020-04-20 DIAGNOSIS — Z7901 Long term (current) use of anticoagulants: Secondary | ICD-10-CM | POA: Diagnosis not present

## 2020-04-20 DIAGNOSIS — I110 Hypertensive heart disease with heart failure: Secondary | ICD-10-CM | POA: Diagnosis not present

## 2020-04-20 DIAGNOSIS — Z9981 Dependence on supplemental oxygen: Secondary | ICD-10-CM | POA: Diagnosis not present

## 2020-04-22 DIAGNOSIS — Z7901 Long term (current) use of anticoagulants: Secondary | ICD-10-CM | POA: Diagnosis not present

## 2020-04-22 DIAGNOSIS — Z9981 Dependence on supplemental oxygen: Secondary | ICD-10-CM | POA: Diagnosis not present

## 2020-04-22 DIAGNOSIS — R319 Hematuria, unspecified: Secondary | ICD-10-CM | POA: Diagnosis not present

## 2020-04-22 DIAGNOSIS — I5031 Acute diastolic (congestive) heart failure: Secondary | ICD-10-CM | POA: Diagnosis not present

## 2020-04-22 DIAGNOSIS — F32A Depression, unspecified: Secondary | ICD-10-CM | POA: Diagnosis not present

## 2020-04-22 DIAGNOSIS — I110 Hypertensive heart disease with heart failure: Secondary | ICD-10-CM | POA: Diagnosis not present

## 2020-04-26 DIAGNOSIS — F32A Depression, unspecified: Secondary | ICD-10-CM | POA: Diagnosis not present

## 2020-04-26 DIAGNOSIS — I110 Hypertensive heart disease with heart failure: Secondary | ICD-10-CM | POA: Diagnosis not present

## 2020-04-26 DIAGNOSIS — R319 Hematuria, unspecified: Secondary | ICD-10-CM | POA: Diagnosis not present

## 2020-04-26 DIAGNOSIS — Z7901 Long term (current) use of anticoagulants: Secondary | ICD-10-CM | POA: Diagnosis not present

## 2020-04-26 DIAGNOSIS — Z9981 Dependence on supplemental oxygen: Secondary | ICD-10-CM | POA: Diagnosis not present

## 2020-04-26 DIAGNOSIS — I5031 Acute diastolic (congestive) heart failure: Secondary | ICD-10-CM | POA: Diagnosis not present

## 2020-04-28 DIAGNOSIS — Z9981 Dependence on supplemental oxygen: Secondary | ICD-10-CM | POA: Diagnosis not present

## 2020-04-28 DIAGNOSIS — R319 Hematuria, unspecified: Secondary | ICD-10-CM | POA: Diagnosis not present

## 2020-04-28 DIAGNOSIS — Z7901 Long term (current) use of anticoagulants: Secondary | ICD-10-CM | POA: Diagnosis not present

## 2020-04-28 DIAGNOSIS — F32A Depression, unspecified: Secondary | ICD-10-CM | POA: Diagnosis not present

## 2020-04-28 DIAGNOSIS — I5031 Acute diastolic (congestive) heart failure: Secondary | ICD-10-CM | POA: Diagnosis not present

## 2020-04-28 DIAGNOSIS — I110 Hypertensive heart disease with heart failure: Secondary | ICD-10-CM | POA: Diagnosis not present

## 2020-04-30 ENCOUNTER — Other Ambulatory Visit: Payer: Self-pay

## 2020-04-30 DIAGNOSIS — I10 Essential (primary) hypertension: Secondary | ICD-10-CM

## 2020-04-30 MED ORDER — FUROSEMIDE 40 MG PO TABS: 40.0000 mg | ORAL_TABLET | Freq: Every day | ORAL | 0 refills | Status: DC

## 2020-04-30 MED ORDER — POTASSIUM CHLORIDE CRYS ER 20 MEQ PO TBCR: 20.0000 meq | EXTENDED_RELEASE_TABLET | Freq: Every day | ORAL | 0 refills | Status: DC

## 2020-04-30 NOTE — Telephone Encounter (Signed)
She is due for follow-up. I sent refills in though we could check labs when she completes follow-up.

## 2020-05-03 DIAGNOSIS — F32A Depression, unspecified: Secondary | ICD-10-CM | POA: Diagnosis not present

## 2020-05-03 DIAGNOSIS — Z7901 Long term (current) use of anticoagulants: Secondary | ICD-10-CM | POA: Diagnosis not present

## 2020-05-03 DIAGNOSIS — Z9981 Dependence on supplemental oxygen: Secondary | ICD-10-CM | POA: Diagnosis not present

## 2020-05-03 DIAGNOSIS — I110 Hypertensive heart disease with heart failure: Secondary | ICD-10-CM | POA: Diagnosis not present

## 2020-05-03 DIAGNOSIS — I5031 Acute diastolic (congestive) heart failure: Secondary | ICD-10-CM | POA: Diagnosis not present

## 2020-05-03 DIAGNOSIS — R319 Hematuria, unspecified: Secondary | ICD-10-CM | POA: Diagnosis not present

## 2020-05-04 DIAGNOSIS — I5031 Acute diastolic (congestive) heart failure: Secondary | ICD-10-CM | POA: Diagnosis not present

## 2020-05-04 DIAGNOSIS — R319 Hematuria, unspecified: Secondary | ICD-10-CM | POA: Diagnosis not present

## 2020-05-04 DIAGNOSIS — Z9981 Dependence on supplemental oxygen: Secondary | ICD-10-CM | POA: Diagnosis not present

## 2020-05-04 DIAGNOSIS — I110 Hypertensive heart disease with heart failure: Secondary | ICD-10-CM | POA: Diagnosis not present

## 2020-05-04 DIAGNOSIS — F32A Depression, unspecified: Secondary | ICD-10-CM | POA: Diagnosis not present

## 2020-05-04 DIAGNOSIS — Z7901 Long term (current) use of anticoagulants: Secondary | ICD-10-CM | POA: Diagnosis not present

## 2020-05-11 DIAGNOSIS — F32A Depression, unspecified: Secondary | ICD-10-CM | POA: Diagnosis not present

## 2020-05-11 DIAGNOSIS — R319 Hematuria, unspecified: Secondary | ICD-10-CM | POA: Diagnosis not present

## 2020-05-11 DIAGNOSIS — Z9981 Dependence on supplemental oxygen: Secondary | ICD-10-CM | POA: Diagnosis not present

## 2020-05-11 DIAGNOSIS — I110 Hypertensive heart disease with heart failure: Secondary | ICD-10-CM | POA: Diagnosis not present

## 2020-05-11 DIAGNOSIS — I5031 Acute diastolic (congestive) heart failure: Secondary | ICD-10-CM | POA: Diagnosis not present

## 2020-05-11 DIAGNOSIS — Z7901 Long term (current) use of anticoagulants: Secondary | ICD-10-CM | POA: Diagnosis not present

## 2020-05-12 DIAGNOSIS — Z9981 Dependence on supplemental oxygen: Secondary | ICD-10-CM | POA: Diagnosis not present

## 2020-05-12 DIAGNOSIS — I110 Hypertensive heart disease with heart failure: Secondary | ICD-10-CM | POA: Diagnosis not present

## 2020-05-12 DIAGNOSIS — R319 Hematuria, unspecified: Secondary | ICD-10-CM | POA: Diagnosis not present

## 2020-05-12 DIAGNOSIS — F32A Depression, unspecified: Secondary | ICD-10-CM | POA: Diagnosis not present

## 2020-05-12 DIAGNOSIS — Z7901 Long term (current) use of anticoagulants: Secondary | ICD-10-CM | POA: Diagnosis not present

## 2020-05-12 DIAGNOSIS — I5031 Acute diastolic (congestive) heart failure: Secondary | ICD-10-CM | POA: Diagnosis not present

## 2020-05-17 ENCOUNTER — Ambulatory Visit (INDEPENDENT_AMBULATORY_CARE_PROVIDER_SITE_OTHER): Payer: Medicare Other

## 2020-05-17 VITALS — Ht 68.0 in | Wt 313.0 lb

## 2020-05-17 DIAGNOSIS — Z0000 Encounter for general adult medical examination without abnormal findings: Secondary | ICD-10-CM

## 2020-05-17 NOTE — Progress Notes (Signed)
Subjective:   Kathleen Barton is a 80 y.o. female who presents for Medicare Annual (Subsequent) preventive examination.  Review of Systems    No ROS.  Medicare Wellness Virtual Visit.   Cardiac Risk Factors include: advanced age (>1855men, 13>65 women);hypertension     Objective:    Today's Vitals   05/17/20 1132  Weight: (!) 313 lb (142 kg)  Height: 5\' 8"  (1.727 m)   Body mass index is 47.59 kg/m.  Advanced Directives 05/17/2020 11/24/2019 05/15/2019 09/08/2016 08/27/2015  Does Patient Have a Medical Advance Directive? No Unable to assess, patient is non-responsive or altered mental status Yes No No  Type of Advance Directive - - Healthcare Power of [place];Living will - -  Does patient want to make changes to medical advance directive? - - No - Patient declined - -  Copy of Healthcare Power of Attorney in Chart? - - No - copy requested - -  Would patient like information on creating a medical advance directive? No - Patient declined - - Yes (MAU/Ambulatory/Procedural Areas - Information given) No - patient declined information    Current Medications (verified) Outpatient Encounter Medications as of 05/17/2020  Medication Sig  . apixaban (ELIQUIS) 5 MG TABS tablet Take 1 tablet (5 mg total) by mouth 2 (two) times daily.  . cholecalciferol (VITAMIN D) 1000 units tablet Take 1,000 Units by mouth daily.  . cyanocobalamin 1000 MCG tablet Take 1,000 mcg by mouth daily.  . furosemide (LASIX) 40 MG tablet Take 1 tablet (40 mg total) by mouth daily.  Marland Kitchen. ipratropium-albuterol (DUONEB) 0.5-2.5 (3) MG/3ML SOLN Take 3 mLs by nebulization 2 (two) times daily.  . meclizine (ANTIVERT) 25 MG tablet Take 25 mg by mouth 3 (three) times daily as needed for dizziness.  . metoprolol succinate (TOPROL-XL) 50 MG 24 hr tablet TAKE ONE TABLET BY MOUTH ONCE DAILY TAKE WITH OR IMMEDIATELY FOLLOWING A MEAL  . naphazoline-glycerin (CLEAR EYES REDNESS) 0.012-0.2 % SOLN Place 1-2 drops into both eyes 4 (four) times  daily as needed for eye irritation.  . potassium chloride SA (KLOR-CON) 20 MEQ tablet Take 1 tablet (20 mEq total) by mouth daily.  . sertraline (ZOLOFT) 50 MG tablet Take 0.5 tablets (25 mg total) by mouth daily for 14 days, THEN 1 tablet (50 mg total) daily.   No facility-administered encounter medications on file as of 05/17/2020.    Allergies (verified) Patient has no known allergies.   History: Past Medical History:  Diagnosis Date  . Hypertension   . Urinary incontinence    Past Surgical History:  Procedure Laterality Date  . ABDOMINAL HYSTERECTOMY  1974   Family History  Problem Relation Age of Onset  . Heart disease Other   . Hypertension Other    Social History   Socioeconomic History  . Marital status: Single    Spouse name: Not on file  . Number of children: Not on file  . Years of education: Not on file  . Highest education level: Not on file  Occupational History  . Not on file  Tobacco Use  . Smoking status: Never Smoker  . Smokeless tobacco: Never Used  Substance and Sexual Activity  . Alcohol use: No    Alcohol/week: 0.0 standard drinks  . Drug use: No  . Sexual activity: Never  Other Topics Concern  . Not on file  Social History Narrative  . Not on file   Social Determinants of Health   Financial Resource Strain: Low Risk   . Difficulty  of Paying Living Expenses: Not hard at all  Food Insecurity: No Food Insecurity  . Worried About Programme researcher, broadcasting/film/video in the Last Year: Never true  . Ran Out of Food in the Last Year: Never true  Transportation Needs: No Transportation Needs  . Lack of Transportation (Medical): No  . Lack of Transportation (Non-Medical): No  Physical Activity: Not on file  Stress: No Stress Concern Present  . Feeling of Stress : Not at all  Social Connections: Unknown  . Frequency of Communication with Friends and Family: More than three times a week  . Frequency of Social Gatherings with Friends and Family: More than three  times a week  . Attends Religious Services: Never  . Active Member of Clubs or Organizations: Yes  . Attends Banker Meetings: Never  . Marital Status: Not on file    Tobacco Counseling Counseling given: Not Answered   Clinical Intake:  Pre-visit preparation completed: Yes        Diabetes: No  How often do you need to have someone help you when you read instructions, pamphlets, or other written materials from your doctor or pharmacy?: 1 - Never   Interpreter Needed?: No      Activities of Daily Living In your present state of health, do you have any difficulty performing the following activities: 05/17/2020 11/24/2019  Hearing? N N  Vision? N Y  Difficulty concentrating or making decisions? N N  Walking or climbing stairs? Y Y  Comment Unsteady gait. Walker in use. -  Dressing or bathing? N Y  Doing errands, shopping? Y N  Comment She does not drive Chief Operating Officer and eating ? N -  Comment Family assist with meal prep. Self feeds -  Using the Toilet? N -  In the past six months, have you accidently leaked urine? N -  Do you have problems with loss of bowel control? N -  Managing your Medications? Y -  Comment Family assist -  Managing your Finances? N -  Housekeeping or managing your Housekeeping? Y -  Comment Maid assist -  Some recent data might be hidden    Patient Care Team: Glori Luis, MD as PCP - General (Family Medicine)  Indicate any recent Medical Services you may have received from other than [facility] providers in the past year (date may be approximate).     Assessment:   This is a routine wellness examination for Kathleen Barton.  I connected with Kathleen Barton today by telephone and verified that I am speaking with the correct person using two identifiers. Location patient: home Location provider: work Persons participating in the virtual visit: patient, Engineer, civil (consulting).    I discussed the limitations, risks, security and privacy concerns of  performing an evaluation and management service by telephone and the availability of in person appointments. The patient expressed understanding and verbally consented to this telephonic visit.    Interactive audio and video telecommunications were attempted between this provider and patient, however failed, due to patient having technical difficulties OR patient did not have access to video capability.  We continued and completed visit with audio only.  Some vital signs may be absent or patient reported.   Hearing/Vision screen  Hearing Screening   125Hz  250Hz  500Hz  1000Hz  2000Hz  3000Hz  4000Hz  6000Hz  8000Hz   Right ear:           Left ear:           Comments: Patient is able to hear conversational tones  without difficulty.  No issues reported.    Vision Screening Comments: Follow[facility]  Wears corrective lenses  Visual acuity not assessed per patient preference since they have regular follow up with the ophthalmologist  Dietary issues and exercise activities discussed: Current Exercise Habits: Home exercise routine, Intensity: Mild  Healthy diet  Good water intake  Goals    . Increase physical activity     Walk for exercise as tolerated      Depression Screen PHQ 2/9 Scores 05/17/2020 01/20/2020 05/15/2019 03/31/2019 11/22/2018 09/08/2016  PHQ - 2 Score 0 0 0 0 0 0  PHQ- 9 Score - - - - - 0    Fall Risk Fall Risk  05/17/2020 01/20/2020 05/15/2019 03/31/2019 11/22/2018  Falls in the past year? 0 0 0 0 0  Number falls in past yr: 0 0 - 0 0  Injury with Fall? 0 - - - -  Follow up Falls evaluation completed Falls evaluation completed Falls evaluation completed Falls evaluation completed -    FALL RISK PREVENTION PERTAINING TO THE HOME: Handrails in use when climbing stairs? Yes Home free of loose throw rugs in walkways, pet beds, electrical cords, etc? Yes  Adequate lighting in your home to reduce risk of falls? Yes   ASSISTIVE DEVICES UTILIZED TO PREVENT  FALLS: Life alert? No   Cell phone on person at all times- Yes Use of a cane, walker or w/c? Yes  Grab bars in the bathroom? Yes  Shower chair or bench in shower? Yes  Elevated toilet seat or a handicapped toilet? Yes   TIMED UP AND GO: Was the test performed? No . Virtual visit.   Cognitive Function: MMSE - Mini Mental State Exam 09/08/2016  Orientation to time 5  Orientation to Place 5  Registration 3  Attention/ Calculation 5  Recall 3  Language- name 2 objects 2  Language- repeat 1  Language- follow 3 step command 3  Language- read & follow direction 1  Write a sentence 1  Copy design 1  Total score 30     6CIT Screen 05/17/2020 05/15/2019  What Year? 0 points 0 points  What month? 0 points 0 points  What time? 0 points 0 points  Count back from 20 0 points 0 points  Months in reverse 0 points 0 points  Repeat phrase 0 points -  Total Score 0 -    Immunizations Immunization History  Administered Date(s) Administered  . Influenza-Unspecified 02/22/2018  . Pneumococcal Conjugate-13 09/08/2016   Health Maintenance Health Maintenance  Topic Date Due  . INFLUENZA VACCINE  07/22/2020 (Originally 11/23/2019)  . DEXA SCAN  05/17/2021 (Originally 07/31/2004)  . TETANUS/TDAP  05/17/2021 (Originally 08/01/1959)  . PNA vac Low Risk Adult (2 of 2 - PPSV23) 05/17/2021 (Originally 09/08/2017)  . COVID-19 Vaccine  Discontinued   Colorectal cancer screening: No longer required.   Mammogram- deferred per patient.   Bone density- deferred per patient.   Covid, Tdap, PNA Vaccine- all discontinued per patient preference.   Lung Cancer Screening: (Low Dose CT Chest recommended if Age 46-80 years, 30 pack-year currently smoking OR have quit w/in 15years.) does not qualify.   Hepatitis C Screening: does not qualify.  Vision Screening: Recommended annual ophthalmology exams for early detection of glaucoma and other disorders of the eye. Is the patient up to date with their annual  eye exam?  Yes   Dental Screening: Recommended annual dental exams for proper oral hygiene  Community Resource Referral / Chronic  Care Management: CRR required this visit?  No   CCM required this visit?  No      Plan:   Keep all routine maintenance appointments.   I have personally reviewed and noted the following in the patient's chart:   . Medical and social history . Use of alcohol, tobacco or illicit drugs  . Current medications and supplements . Functional ability and status . Nutritional status . Physical activity . Advanced directives . List of other physicians . Hospitalizations, surgeries, and ER visits in previous 12 months . Vitals . Screenings to include cognitive, depression, and falls . Referrals and appointments  In addition, I have reviewed and discussed with patient certain preventive protocols, quality metrics, and best practice recommendations. A written personalized care plan for preventive services as well as general preventive health recommendations were provided to patient via mail.     Ashok Pall, LPN   [id number]/70/2637

## 2020-05-17 NOTE — Patient Instructions (Addendum)
Kathleen Barton , Thank you for taking time to come for your Medicare Wellness Visit. I appreciate your ongoing commitment to your health goals. Please review the following plan we discussed and let me know if I can assist you in the future.   These are the goals we discussed: Goals    . Increase physical activity     Walk for exercise as tolerated       This is a list of the screening recommended for you and due dates:  Health Maintenance  Topic Date Due  . Flu Shot  07/22/2020*  . DEXA scan (bone density measurement)  05/17/2021*  . Tetanus Vaccine  05/17/2021*  . Pneumonia vaccines (2 of 2 - PPSV23) 05/17/2021*  . COVID-19 Vaccine  Discontinued  *Topic was postponed. The date shown is not the original due date.     Immunizations Immunization History  Administered Date(s) Administered  . Influenza-Unspecified 02/22/2018  . Pneumococcal Conjugate-13 09/08/2016   Advanced directives:   Conditions/risks identified: none new  Follow up in one year for your annual wellness visit    Preventive Care 65 Years and Older, Female Preventive care refers to lifestyle choices and visits with your health care provider that can promote health and wellness. What does preventive care include?  A yearly physical exam. This is also called an annual well check.  Dental exams once or twice a year.  Routine eye exams. Ask your health care provider how often you should have your eyes checked.  Personal lifestyle choices, including:  Daily care of your teeth and gums.  Regular physical activity.  Eating a healthy diet.  Avoiding tobacco and drug use.  Limiting alcohol use.  Practicing safe sex.  Taking low-dose aspirin every day.  Taking vitamin and mineral supplements as recommended by your health care provider. What happens during an annual well check? The services and screenings done by your health care provider during your annual well check will depend on your age, overall  health, lifestyle risk factors, and family history of disease. Counseling  Your health care provider may ask you questions about your:  Alcohol use.  Tobacco use.  Drug use.  Emotional well-being.  Home and relationship well-being.  Sexual activity.  Eating habits.  History of falls.  Memory and ability to understand (cognition).  Work and work Astronomer.  Reproductive health. Screening  You may have the following tests or measurements:  Height, weight, and BMI.  Blood pressure.  Lipid and cholesterol levels. These may be checked every 5 years, or more frequently if you are over 49 years old.  Skin check.  Lung cancer screening. You may have this screening every year starting at age 54 if you have a 30-pack-year history of smoking and currently smoke or have quit within the past 15 years.  Fecal occult blood test (FOBT) of the stool. You may have this test every year starting at age 93.  Flexible sigmoidoscopy or colonoscopy. You may have a sigmoidoscopy every 5 years or a colonoscopy every 10 years starting at age 73.  Hepatitis C blood test.  Hepatitis B blood test.  Sexually transmitted disease (STD) testing.  Diabetes screening. This is done by checking your blood sugar (glucose) after you have not eaten for a while (fasting). You may have this done every 1-3 years.  Bone density scan. This is done to screen for osteoporosis. You may have this done starting at age 60.  Mammogram. This may be done every 1-2 years. Talk to  your health care provider about how often you should have regular mammograms. Talk with your health care provider about your test results, treatment options, and if necessary, the need for more tests. Vaccines  Your health care provider may recommend certain vaccines, such as:  Influenza vaccine. This is recommended every year.  Tetanus, diphtheria, and acellular pertussis (Tdap, Td) vaccine. You may need a Td booster every 10  years.  Zoster vaccine. You may need this after age 74.  Pneumococcal 13-valent conjugate (PCV13) vaccine. One dose is recommended after age 35.  Pneumococcal polysaccharide (PPSV23) vaccine. One dose is recommended after age 6. Talk to your health care provider about which screenings and vaccines you need and how often you need them. This information is not intended to replace advice given to you by your health care provider. Make sure you discuss any questions you have with your health care provider. Document Released: 05/07/2015 Document Revised: 12/29/2015 Document Reviewed: 02/09/2015 Elsevier Interactive Patient Education  2017 [employer].  Fall Prevention in the Home Falls can cause injuries. They can happen to people of all ages. There are many things you can do to make your home safe and to help prevent falls. What can I do on the outside of my home?  Regularly fix the edges of walkways and driveways and fix any cracks.  Remove anything that might make you trip as you walk through a door, such as a raised step or threshold.  Trim any bushes or trees on the path to your home.  Use bright outdoor lighting.  Clear any walking paths of anything that might make someone trip, such as rocks or tools.  Regularly check to see if handrails are loose or broken. Make sure that both sides of any steps have handrails.  Any raised decks and porches should have guardrails on the edges.  Have any leaves, snow, or ice cleared regularly.  Use sand or salt on walking paths during winter.  Clean up any spills in your garage right away. This includes oil or grease spills. What can I do in the bathroom?  Use night lights.  Install grab bars by the toilet and in the tub and shower. Do not use towel bars as grab bars.  Use non-skid mats or decals in the tub or shower.  If you need to sit down in the shower, use a plastic, non-slip stool.  Keep the floor dry. Clean up any water that  spills on the floor as soon as it happens.  Remove soap buildup in the tub or shower regularly.  Attach bath mats securely with double-sided non-slip rug tape.  Do not have throw rugs and other things on the floor that can make you trip. What can I do in the bedroom?  Use night lights.  Make sure that you have a light by your bed that is easy to reach.  Do not use any sheets or blankets that are too big for your bed. They should not hang down onto the floor.  Have a firm chair that has side arms. You can use this for support while you get dressed.  Do not have throw rugs and other things on the floor that can make you trip. What can I do in the kitchen?  Clean up any spills right away.  Avoid walking on wet floors.  Keep items that you use a lot in easy-to-reach places.  If you need to reach something above you, use a strong step stool that has  a grab bar.  Keep electrical cords out of the way.  Do not use floor polish or wax that makes floors slippery. If you must use wax, use non-skid floor wax.  Do not have throw rugs and other things on the floor that can make you trip. What can I do with my stairs?  Do not leave any items on the stairs.  Make sure that there are handrails on both sides of the stairs and use them. Fix handrails that are broken or loose. Make sure that handrails are as long as the stairways.  Check any carpeting to make sure that it is firmly attached to the stairs. Fix any carpet that is loose or worn.  Avoid having throw rugs at the top or bottom of the stairs. If you do have throw rugs, attach them to the floor with carpet tape.  Make sure that you have a light switch at the top of the stairs and the bottom of the stairs. If you do not have them, ask someone to add them for you. What else can I do to help prevent falls?  Wear shoes that:  Do not have high heels.  Have rubber bottoms.  Are comfortable and fit you well.  Are closed at the  toe. Do not wear sandals.  If you use a stepladder:  Make sure that it is fully opened. Do not climb a closed stepladder.  Make sure that both sides of the stepladder are locked into place.  Ask someone to hold it for you, if possible.  Clearly mark and make sure that you can see:  Any grab bars or handrails.  First and last steps.  Where the edge of each step is.  Use tools that help you move around (mobility aids) if they are needed. These include:  Canes.  Walkers.  Scooters.  Crutches.  Turn on the lights when you go into a dark area. Replace any light bulbs as soon as they burn out.  Set up your furniture so you have a clear path. Avoid moving your furniture around.  If any of your floors are uneven, fix them.  If there are any pets around you, be aware of where they are.  Review your medicines with your doctor. Some medicines can make you feel dizzy. This can increase your chance of falling. Ask your doctor what other things that you can do to help prevent falls. This information is not intended to replace advice given to you by your health care provider. Make sure you discuss any questions you have with your health care provider. Document Released: 02/04/2009 Document Revised: 09/16/2015 Document Reviewed: 05/15/2014 Elsevier Interactive Patient Education  2017 [employer].

## 2020-05-19 DIAGNOSIS — F32A Depression, unspecified: Secondary | ICD-10-CM | POA: Diagnosis not present

## 2020-05-19 DIAGNOSIS — R319 Hematuria, unspecified: Secondary | ICD-10-CM | POA: Diagnosis not present

## 2020-05-19 DIAGNOSIS — Z7901 Long term (current) use of anticoagulants: Secondary | ICD-10-CM | POA: Diagnosis not present

## 2020-05-19 DIAGNOSIS — Z9981 Dependence on supplemental oxygen: Secondary | ICD-10-CM | POA: Diagnosis not present

## 2020-05-19 DIAGNOSIS — I110 Hypertensive heart disease with heart failure: Secondary | ICD-10-CM | POA: Diagnosis not present

## 2020-05-19 DIAGNOSIS — I5031 Acute diastolic (congestive) heart failure: Secondary | ICD-10-CM | POA: Diagnosis not present

## 2020-05-20 DIAGNOSIS — I5033 Acute on chronic diastolic (congestive) heart failure: Secondary | ICD-10-CM | POA: Diagnosis not present

## 2020-05-20 DIAGNOSIS — G9341 Metabolic encephalopathy: Secondary | ICD-10-CM | POA: Diagnosis not present

## 2020-05-20 DIAGNOSIS — J9601 Acute respiratory failure with hypoxia: Secondary | ICD-10-CM | POA: Diagnosis not present

## 2020-05-20 DIAGNOSIS — J9620 Acute and chronic respiratory failure, unspecified whether with hypoxia or hypercapnia: Secondary | ICD-10-CM | POA: Diagnosis not present

## 2020-05-20 DIAGNOSIS — U071 COVID-19: Secondary | ICD-10-CM | POA: Diagnosis not present

## 2020-05-21 ENCOUNTER — Institutional Professional Consult (permissible substitution): Payer: Medicare Other | Admitting: Pulmonary Disease

## 2020-05-27 ENCOUNTER — Telehealth: Payer: Self-pay

## 2020-05-27 DIAGNOSIS — I10 Essential (primary) hypertension: Secondary | ICD-10-CM

## 2020-05-27 DIAGNOSIS — I5031 Acute diastolic (congestive) heart failure: Secondary | ICD-10-CM

## 2020-05-27 MED ORDER — POTASSIUM CHLORIDE CRYS ER 20 MEQ PO TBCR: 20.0000 meq | EXTENDED_RELEASE_TABLET | Freq: Every day | ORAL | 0 refills | Status: DC

## 2020-05-27 MED ORDER — FUROSEMIDE 40 MG PO TABS: 40.0000 mg | ORAL_TABLET | Freq: Every day | ORAL | 0 refills | Status: DC

## 2020-05-27 MED ORDER — SERTRALINE HCL 50 MG PO TABS: ORAL_TABLET | ORAL | 1 refills | Status: DC

## 2020-05-27 MED ORDER — METOPROLOL SUCCINATE ER 50 MG PO TB24: ORAL_TABLET | ORAL | 0 refills | Status: DC

## 2020-05-27 NOTE — Telephone Encounter (Signed)
[facility] is requesting refills for furosemide, metoprolol, potassium chloride, and sertraline.

## 2020-06-02 ENCOUNTER — Institutional Professional Consult (permissible substitution): Payer: Medicare Other | Admitting: Primary Care

## 2020-06-20 DIAGNOSIS — U071 COVID-19: Secondary | ICD-10-CM | POA: Diagnosis not present

## 2020-06-20 DIAGNOSIS — J9620 Acute and chronic respiratory failure, unspecified whether with hypoxia or hypercapnia: Secondary | ICD-10-CM | POA: Diagnosis not present

## 2020-06-20 DIAGNOSIS — J9601 Acute respiratory failure with hypoxia: Secondary | ICD-10-CM | POA: Diagnosis not present

## 2020-06-20 DIAGNOSIS — I5033 Acute on chronic diastolic (congestive) heart failure: Secondary | ICD-10-CM | POA: Diagnosis not present

## 2020-06-20 DIAGNOSIS — G9341 Metabolic encephalopathy: Secondary | ICD-10-CM | POA: Diagnosis not present

## 2020-06-21 ENCOUNTER — Other Ambulatory Visit: Payer: Self-pay

## 2020-06-21 DIAGNOSIS — I10 Essential (primary) hypertension: Secondary | ICD-10-CM

## 2020-06-21 MED ORDER — FUROSEMIDE 40 MG PO TABS: 40.0000 mg | ORAL_TABLET | Freq: Every day | ORAL | 0 refills | Status: DC

## 2020-06-24 ENCOUNTER — Other Ambulatory Visit: Payer: Self-pay

## 2020-06-24 DIAGNOSIS — I10 Essential (primary) hypertension: Secondary | ICD-10-CM

## 2020-06-24 DIAGNOSIS — I5031 Acute diastolic (congestive) heart failure: Secondary | ICD-10-CM

## 2020-06-24 MED ORDER — POTASSIUM CHLORIDE CRYS ER 20 MEQ PO TBCR: 20.0000 meq | EXTENDED_RELEASE_TABLET | Freq: Every day | ORAL | 0 refills | Status: DC

## 2020-06-24 MED ORDER — METOPROLOL SUCCINATE ER 50 MG PO TB24: ORAL_TABLET | ORAL | 0 refills | Status: DC

## 2020-06-24 MED ORDER — FUROSEMIDE 40 MG PO TABS: 40.0000 mg | ORAL_TABLET | Freq: Every day | ORAL | 0 refills | Status: DC

## 2020-06-30 ENCOUNTER — Institutional Professional Consult (permissible substitution): Payer: Medicare Other | Admitting: Primary Care

## 2020-07-14 ENCOUNTER — Telehealth: Payer: Self-pay | Admitting: Family Medicine

## 2020-07-14 NOTE — Telephone Encounter (Signed)
Needing Clarification on sertraline instructions. Please call pharmacist Renee.

## 2020-07-14 NOTE — Telephone Encounter (Signed)
I called the pharmacist and informed him that the patient started the medication with 1/2 and went up to the 1 pill a day 50 mg and he understood.  Nina,cma

## 2020-07-18 DIAGNOSIS — J9620 Acute and chronic respiratory failure, unspecified whether with hypoxia or hypercapnia: Secondary | ICD-10-CM | POA: Diagnosis not present

## 2020-07-18 DIAGNOSIS — U071 COVID-19: Secondary | ICD-10-CM | POA: Diagnosis not present

## 2020-07-18 DIAGNOSIS — I5033 Acute on chronic diastolic (congestive) heart failure: Secondary | ICD-10-CM | POA: Diagnosis not present

## 2020-07-18 DIAGNOSIS — G9341 Metabolic encephalopathy: Secondary | ICD-10-CM | POA: Diagnosis not present

## 2020-07-18 DIAGNOSIS — J9601 Acute respiratory failure with hypoxia: Secondary | ICD-10-CM | POA: Diagnosis not present

## 2020-08-13 ENCOUNTER — Telehealth: Payer: Self-pay | Admitting: Family Medicine

## 2020-08-13 ENCOUNTER — Other Ambulatory Visit: Payer: Self-pay

## 2020-08-13 DIAGNOSIS — I5031 Acute diastolic (congestive) heart failure: Secondary | ICD-10-CM

## 2020-08-13 DIAGNOSIS — I10 Essential (primary) hypertension: Secondary | ICD-10-CM

## 2020-08-13 MED ORDER — METOPROLOL SUCCINATE ER 50 MG PO TB24: ORAL_TABLET | ORAL | 0 refills | Status: DC

## 2020-08-13 MED ORDER — POTASSIUM CHLORIDE CRYS ER 20 MEQ PO TBCR: 20.0000 meq | EXTENDED_RELEASE_TABLET | Freq: Every day | ORAL | 0 refills | Status: DC

## 2020-08-13 NOTE — Telephone Encounter (Signed)
Rx called in to request a refill order of potassium chloride SA (KLOR-CON) 20 MEQ tablet and metoprolol succinate (TOPROL-XL) 50 MG 24

## 2020-08-13 NOTE — Telephone Encounter (Signed)
I called patient to let her know that medications have been sent [facility].

## 2020-08-18 DIAGNOSIS — U071 COVID-19: Secondary | ICD-10-CM | POA: Diagnosis not present

## 2020-08-18 DIAGNOSIS — J9620 Acute and chronic respiratory failure, unspecified whether with hypoxia or hypercapnia: Secondary | ICD-10-CM | POA: Diagnosis not present

## 2020-08-18 DIAGNOSIS — J9601 Acute respiratory failure with hypoxia: Secondary | ICD-10-CM | POA: Diagnosis not present

## 2020-08-18 DIAGNOSIS — G9341 Metabolic encephalopathy: Secondary | ICD-10-CM | POA: Diagnosis not present

## 2020-08-18 DIAGNOSIS — I5033 Acute on chronic diastolic (congestive) heart failure: Secondary | ICD-10-CM | POA: Diagnosis not present

## 2020-09-06 ENCOUNTER — Telehealth: Payer: Self-pay

## 2020-09-06 NOTE — Telephone Encounter (Signed)
PT called in regards to these medications. Stating she needs a refill for all of her meds and stated that she would like to have a telephone call with Dr.Sonnenberg to get them refill. PT advise it is hard for her to get in office since she has a wheelchair and stroller.

## 2020-09-06 NOTE — Telephone Encounter (Signed)
A fax was sent to pharmacy denying the medication Potasium, lasix and metoprolol because patient needs a follow up appt.  This was sent to pharmacy.  Daphne Karrer,cma

## 2020-09-06 NOTE — Telephone Encounter (Signed)
She has not been seen in person since 2020.  She needs to have an in person visit to discuss medication refills.  Please get her scheduled.

## 2020-09-06 NOTE — Telephone Encounter (Signed)
No answer and nov VM.  Kathleen Barton,cma

## 2020-09-06 NOTE — Telephone Encounter (Signed)
Patient is wanting refills on all medications but this is what provider states "She has not been seen in person since 2020.  She needs to have an in person visit to discuss medication refills.  Please get her scheduled."  Patient needs to be seen in person.  Kathleen Barton,cma

## 2020-09-07 NOTE — Telephone Encounter (Signed)
Pt called and scheduled appt for 09/15/20 at 11:30

## 2020-09-15 ENCOUNTER — Encounter: Payer: Self-pay | Admitting: Family Medicine

## 2020-09-15 ENCOUNTER — Other Ambulatory Visit: Payer: Self-pay | Admitting: Family Medicine

## 2020-09-15 ENCOUNTER — Other Ambulatory Visit: Payer: Self-pay

## 2020-09-15 ENCOUNTER — Ambulatory Visit (INDEPENDENT_AMBULATORY_CARE_PROVIDER_SITE_OTHER): Payer: Medicare Other | Admitting: Family Medicine

## 2020-09-15 VITALS — BP 140/80 | HR 107 | Temp 98.4°F | Ht 68.0 in | Wt 325.6 lb

## 2020-09-15 DIAGNOSIS — I10 Essential (primary) hypertension: Secondary | ICD-10-CM | POA: Diagnosis not present

## 2020-09-15 DIAGNOSIS — J9611 Chronic respiratory failure with hypoxia: Secondary | ICD-10-CM

## 2020-09-15 DIAGNOSIS — D649 Anemia, unspecified: Secondary | ICD-10-CM

## 2020-09-15 DIAGNOSIS — J9612 Chronic respiratory failure with hypercapnia: Secondary | ICD-10-CM

## 2020-09-15 DIAGNOSIS — M79642 Pain in left hand: Secondary | ICD-10-CM | POA: Diagnosis not present

## 2020-09-15 DIAGNOSIS — G4733 Obstructive sleep apnea (adult) (pediatric): Secondary | ICD-10-CM | POA: Diagnosis not present

## 2020-09-15 DIAGNOSIS — M79641 Pain in right hand: Secondary | ICD-10-CM | POA: Diagnosis not present

## 2020-09-15 DIAGNOSIS — Z86718 Personal history of other venous thrombosis and embolism: Secondary | ICD-10-CM

## 2020-09-15 DIAGNOSIS — F3342 Major depressive disorder, recurrent, in full remission: Secondary | ICD-10-CM

## 2020-09-15 DIAGNOSIS — I5032 Chronic diastolic (congestive) heart failure: Secondary | ICD-10-CM | POA: Diagnosis not present

## 2020-09-15 LAB — CBC
HCT: 32.3 % — ABNORMAL LOW (ref 36.0–46.0)
Hemoglobin: 10.7 g/dL — ABNORMAL LOW (ref 12.0–15.0)
MCHC: 33 g/dL (ref 30.0–36.0)
MCV: 92.7 fl (ref 78.0–100.0)
Platelets: 185 10*3/uL (ref 150.0–400.0)
RBC: 3.49 Mil/uL — ABNORMAL LOW (ref 3.87–5.11)
RDW: 13.4 % (ref 11.5–15.5)
WBC: 8.5 10*3/uL (ref 4.0–10.5)

## 2020-09-15 LAB — COMPREHENSIVE METABOLIC PANEL
ALT: 10 U/L (ref 0–35)
AST: 8 U/L (ref 0–37)
Albumin: 3.8 g/dL (ref 3.5–5.2)
Alkaline Phosphatase: 64 U/L (ref 39–117)
BUN: 21 mg/dL (ref 6–23)
CO2: 32 mEq/L (ref 19–32)
Calcium: 9.6 mg/dL (ref 8.4–10.5)
Chloride: 100 mEq/L (ref 96–112)
Creatinine, Ser: 0.77 mg/dL (ref 0.40–1.20)
GFR: 72.49 mL/min (ref >60.00)
Glucose, Bld: 169 mg/dL — ABNORMAL HIGH (ref 70–99)
Potassium: 4.3 mEq/L (ref 3.5–5.1)
Sodium: 142 mEq/L (ref 135–145)
Total Bilirubin: 0.4 mg/dL (ref 0.2–1.2)
Total Protein: 7.1 g/dL (ref 6.0–8.3)

## 2020-09-15 LAB — HEMOGLOBIN A1C: Hgb A1c MFr Bld: 5.7 % (ref 4.6–6.5)

## 2020-09-15 LAB — BRAIN NATRIURETIC PEPTIDE: Pro B Natriuretic peptide (BNP): 37 pg/mL (ref 0.0–100.0)

## 2020-09-15 NOTE — Progress Notes (Signed)
Tommi Rumps, MD Phone: [phone]  Kathleen Barton is a 80 y.o. female who presents today for f/u.  HYPERTENSION  Disease Monitoring  Home BP Monitoring 130s/70s Chest pain- no    Dyspnea- no Medications  Compliance-  Taking lasix daily, though skipped today, metoprolol.  Edema- chronic and stable, though slight increase today with skipping lasix  History of DVT: chronic and stable swelling. Remains on eliquis. No change to activity level.  Depression: on zoloft. No depression. Requests to stay on zoloft.   Chronic respiratory failure: Patient is currently on nasal cannula oxygen 24/7.  Denies shortness of breath.  Chronic diastolic heart failure: Currently on metoprolol and daily Lasix.  OSA: Patient is not using her BiPAP given difficulty tolerating the mask.  Bilateral hand pain: Patient notes this has been bothering her for some time now.  She has discomfort in her MCP, PIP, and DIP joints of her thumb, index finger, and middle finger.  She tries to keep them warm which is beneficial.  She also rubs lotion on them which is beneficial.  Tylenol is somewhat beneficial as well.  Social History   Tobacco Use  Smoking Status Never Smoker  Smokeless Tobacco Never Used    Current Outpatient Medications on File Prior to Visit  Medication Sig Dispense Refill  . apixaban (ELIQUIS) 5 MG TABS tablet Take 1 tablet (5 mg total) by mouth 2 (two) times daily. 180 tablet 1  . cholecalciferol (VITAMIN D) 1000 units tablet Take 1,000 Units by mouth daily.    . cyanocobalamin 1000 MCG tablet Take 1,000 mcg by mouth daily.    . furosemide (LASIX) 40 MG tablet Take 1 tablet (40 mg total) by mouth daily. 30 tablet 0  . ipratropium-albuterol (DUONEB) 0.5-2.5 (3) MG/3ML SOLN Take 3 mLs by nebulization 2 (two) times daily. 360 mL 0  . meclizine (ANTIVERT) 25 MG tablet Take 25 mg by mouth 3 (three) times daily as needed for dizziness.    . metoprolol succinate (TOPROL-XL) 50 MG 24 hr tablet TAKE  ONE TABLET BY MOUTH ONCE DAILY TAKE WITH OR IMMEDIATELY FOLLOWING A MEAL 30 tablet 0  . naphazoline-glycerin (CLEAR EYES REDNESS) 0.012-0.2 % SOLN Place 1-2 drops into both eyes 4 (four) times daily as needed for eye irritation. 6 mL 0  . potassium chloride SA (KLOR-CON) 20 MEQ tablet Take 1 tablet (20 mEq total) by mouth daily. 30 tablet 0  . sertraline (ZOLOFT) 50 MG tablet Take 0.5 tablets (25 mg total) by mouth daily for 14 days, THEN 1 tablet (50 mg total) daily. 90 tablet 1   No current facility-administered medications on file prior to visit.     ROS see history of present illness  Objective  Physical Exam Vitals:   09/15/20 1143  BP: 140/80  Pulse: (!) 107  Temp: 98.4 F (36.9 C)  SpO2: 93%    BP Readings from Last 3 Encounters:  09/15/20 140/80  12/02/19 130/63  06/08/17 140/74   Wt Readings from Last 3 Encounters:  09/15/20 (!) 325 lb 9.6 oz (147.7 kg)  05/17/20 (!) 313 lb (142 kg)  01/20/20 (!) 313 lb (142 kg)    Physical Exam Constitutional:      General: She is not in acute distress.    Appearance: She is not diaphoretic.  Cardiovascular:     Rate and Rhythm: Normal rate and regular rhythm.     Heart sounds: Normal heart sounds.  Pulmonary:     Effort: Pulmonary effort is normal.  Comments: Faint bibasilar crackles Musculoskeletal:     Comments: Bilateral thumbs, index fingers, and middle fingers with no joint swelling, tenderness, or erythema  Skin:    General: Skin is warm and dry.  Neurological:     Mental Status: She is alert.      Assessment/Plan: Please see individual problem list.  Problem List Items Addressed This Visit    History of DVT of lower extremity    Continue Eliquis as her immobility risk factor has not changed.  She will monitor for bleeding issues.      Relevant Orders   CBC   Essential hypertension    Adequately controlled.  She will continue Lasix and metoprolol.  Check labs.      Relevant Orders   Comp Met  (CMET)   Depression    Asymptomatic.  She wants to continue on Zoloft.      Chronic diastolic heart failure ([place]) - Primary    The patient has possible slight fluid accumulation with some swelling in her legs and and faint bibasilar crackles.  I encouraged her to continue with her Lasix on a daily basis.  We will check a BNP.      Relevant Orders   B Nat Peptide   Chronic respiratory failure ([place])    She will remain on oxygen by nasal cannula.  We are going to refer her to pulmonology given her OSA.      OSA (obstructive sleep apnea)    Refer to pulmonology to get help with management of her BiPAP given her intolerance of the mask.      Bilateral hand pain    Suspect osteoarthritis.  She can continue Tylenol 1000 mg 3 times a day.  They can also try over-the-counter Voltaren.  Advised against oral NSAID use given that she is on Eliquis.       Other Visit Diagnoses    Morbid obesity ([place])       Relevant Orders   HgB A1c       Return in about 6 months (around 03/18/2021) for HTN, diastolic CHF.  This visit occurred during the SARS-CoV-2 public health emergency.  Safety protocols were in place, including screening questions prior to the visit, additional usage of staff PPE, and extensive cleaning of exam room while observing appropriate contact time as indicated for disinfecting solutions.    Tommi Rumps, MD [place]

## 2020-09-15 NOTE — Assessment & Plan Note (Signed)
Continue Eliquis as her immobility risk factor has not changed.  She will monitor for bleeding issues.

## 2020-09-15 NOTE — Assessment & Plan Note (Signed)
The patient has possible slight fluid accumulation with some swelling in her legs and and faint bibasilar crackles.  I encouraged her to continue with her Lasix on a daily basis.  We will check a BNP.

## 2020-09-15 NOTE — Assessment & Plan Note (Signed)
She will remain on oxygen by nasal cannula.  We are going to refer her to pulmonology given her OSA.

## 2020-09-15 NOTE — Assessment & Plan Note (Signed)
Adequately controlled.  She will continue Lasix and metoprolol.  Check labs.

## 2020-09-15 NOTE — Assessment & Plan Note (Signed)
Refer to pulmonology to get help with management of her BiPAP given her intolerance of the mask.

## 2020-09-15 NOTE — Patient Instructions (Signed)
Nice to see you. We will get lab work today. Please make sure you are taking the Lasix on a daily basis. If you develop increasing swelling or shortness of breath please let us know.

## 2020-09-15 NOTE — Assessment & Plan Note (Signed)
Asymptomatic.  She wants to continue on Zoloft.

## 2020-09-15 NOTE — Assessment & Plan Note (Signed)
Suspect osteoarthritis.  She can continue Tylenol 1000 mg 3 times a day.  They can also try over-the-counter Voltaren.  Advised against oral NSAID use given that she is on Eliquis.

## 2020-09-16 ENCOUNTER — Other Ambulatory Visit (INDEPENDENT_AMBULATORY_CARE_PROVIDER_SITE_OTHER): Payer: Medicare Other

## 2020-09-16 ENCOUNTER — Telehealth: Payer: Self-pay | Admitting: Family Medicine

## 2020-09-16 DIAGNOSIS — I10 Essential (primary) hypertension: Secondary | ICD-10-CM

## 2020-09-16 DIAGNOSIS — I5031 Acute diastolic (congestive) heart failure: Secondary | ICD-10-CM

## 2020-09-16 DIAGNOSIS — D649 Anemia, unspecified: Secondary | ICD-10-CM

## 2020-09-16 LAB — IBC + FERRITIN
Ferritin: 78.4 ng/mL (ref 10.0–291.0)
Iron: 48 ug/dL (ref 42–145)
Saturation Ratios: 12.5 % — ABNORMAL LOW (ref 20.0–50.0)
Transferrin: 275 mg/dL (ref 212.0–360.0)

## 2020-09-16 MED ORDER — METOPROLOL SUCCINATE ER 50 MG PO TB24: ORAL_TABLET | ORAL | 0 refills | Status: DC

## 2020-09-16 MED ORDER — FUROSEMIDE 40 MG PO TABS: 40.0000 mg | ORAL_TABLET | Freq: Every day | ORAL | 0 refills | Status: DC

## 2020-09-16 MED ORDER — POTASSIUM CHLORIDE CRYS ER 20 MEQ PO TBCR: 20.0000 meq | EXTENDED_RELEASE_TABLET | Freq: Every day | ORAL | 0 refills | Status: DC

## 2020-09-16 NOTE — Telephone Encounter (Signed)
Rx called to request a refill for the PTs meds:  metoprolol succinate (TOPROL-XL) 50 MG 24 hr tablet potassium chloride SA (KLOR-CON) 20 MEQ tablet furosemide (LASIX) 40 MG tablet

## 2020-09-17 DIAGNOSIS — G9341 Metabolic encephalopathy: Secondary | ICD-10-CM | POA: Diagnosis not present

## 2020-09-17 DIAGNOSIS — J9620 Acute and chronic respiratory failure, unspecified whether with hypoxia or hypercapnia: Secondary | ICD-10-CM | POA: Diagnosis not present

## 2020-09-17 DIAGNOSIS — I5033 Acute on chronic diastolic (congestive) heart failure: Secondary | ICD-10-CM | POA: Diagnosis not present

## 2020-09-17 DIAGNOSIS — J9601 Acute respiratory failure with hypoxia: Secondary | ICD-10-CM | POA: Diagnosis not present

## 2020-09-17 DIAGNOSIS — U071 COVID-19: Secondary | ICD-10-CM | POA: Diagnosis not present

## 2020-09-21 ENCOUNTER — Other Ambulatory Visit: Payer: Self-pay

## 2020-09-21 ENCOUNTER — Telehealth: Payer: Self-pay | Admitting: Family Medicine

## 2020-09-21 ENCOUNTER — Other Ambulatory Visit: Payer: Self-pay | Admitting: Family Medicine

## 2020-09-21 DIAGNOSIS — I10 Essential (primary) hypertension: Secondary | ICD-10-CM

## 2020-09-21 DIAGNOSIS — I5031 Acute diastolic (congestive) heart failure: Secondary | ICD-10-CM

## 2020-09-21 DIAGNOSIS — D649 Anemia, unspecified: Secondary | ICD-10-CM

## 2020-09-21 MED ORDER — METOPROLOL SUCCINATE ER 50 MG PO TB24: ORAL_TABLET | ORAL | 1 refills | Status: DC

## 2020-09-21 MED ORDER — POTASSIUM CHLORIDE CRYS ER 20 MEQ PO TBCR: 20.0000 meq | EXTENDED_RELEASE_TABLET | Freq: Every day | ORAL | 1 refills | Status: DC

## 2020-09-21 MED ORDER — FUROSEMIDE 40 MG PO TABS: 40.0000 mg | ORAL_TABLET | Freq: Every day | ORAL | 1 refills | Status: DC

## 2020-09-21 NOTE — Telephone Encounter (Signed)
Spoken to patient. All questions have been answered.

## 2020-09-21 NOTE — Telephone Encounter (Signed)
Patient would like Mal Amabile to call her back. She has some questions about labs.

## 2020-09-28 ENCOUNTER — Other Ambulatory Visit: Payer: Medicare Other

## 2020-10-12 ENCOUNTER — Other Ambulatory Visit: Payer: Medicare Other

## 2020-10-18 DIAGNOSIS — I5033 Acute on chronic diastolic (congestive) heart failure: Secondary | ICD-10-CM | POA: Diagnosis not present

## 2020-10-18 DIAGNOSIS — U071 COVID-19: Secondary | ICD-10-CM | POA: Diagnosis not present

## 2020-10-18 DIAGNOSIS — J9601 Acute respiratory failure with hypoxia: Secondary | ICD-10-CM | POA: Diagnosis not present

## 2020-10-18 DIAGNOSIS — G9341 Metabolic encephalopathy: Secondary | ICD-10-CM | POA: Diagnosis not present

## 2020-10-18 DIAGNOSIS — J9620 Acute and chronic respiratory failure, unspecified whether with hypoxia or hypercapnia: Secondary | ICD-10-CM | POA: Diagnosis not present

## 2020-11-01 ENCOUNTER — Other Ambulatory Visit: Payer: Medicare Other

## 2020-11-17 DIAGNOSIS — U071 COVID-19: Secondary | ICD-10-CM | POA: Diagnosis not present

## 2020-11-17 DIAGNOSIS — J9601 Acute respiratory failure with hypoxia: Secondary | ICD-10-CM | POA: Diagnosis not present

## 2020-11-17 DIAGNOSIS — J9620 Acute and chronic respiratory failure, unspecified whether with hypoxia or hypercapnia: Secondary | ICD-10-CM | POA: Diagnosis not present

## 2020-11-17 DIAGNOSIS — G9341 Metabolic encephalopathy: Secondary | ICD-10-CM | POA: Diagnosis not present

## 2020-11-17 DIAGNOSIS — I5033 Acute on chronic diastolic (congestive) heart failure: Secondary | ICD-10-CM | POA: Diagnosis not present

## 2020-11-23 ENCOUNTER — Other Ambulatory Visit: Payer: Medicare Other

## 2020-12-02 ENCOUNTER — Telehealth: Payer: Self-pay | Admitting: Family Medicine

## 2020-12-02 DIAGNOSIS — I10 Essential (primary) hypertension: Secondary | ICD-10-CM

## 2020-12-02 MED ORDER — POTASSIUM CHLORIDE CRYS ER 20 MEQ PO TBCR: 20.0000 meq | EXTENDED_RELEASE_TABLET | Freq: Every day | ORAL | 1 refills | Status: DC

## 2020-12-02 NOTE — Telephone Encounter (Signed)
Patient needs a refill on her potassium chloride SA (KLOR-CON) 20 MEQ tablet

## 2020-12-02 NOTE — Telephone Encounter (Signed)
Medication has been refilled.

## 2020-12-09 ENCOUNTER — Other Ambulatory Visit: Payer: Self-pay

## 2020-12-10 MED ORDER — SERTRALINE HCL 50 MG PO TABS: 50.0000 mg | ORAL_TABLET | Freq: Every day | ORAL | 1 refills | Status: AC

## 2020-12-10 MED ORDER — APIXABAN 5 MG PO TABS: 5.0000 mg | ORAL_TABLET | Freq: Two times a day (BID) | ORAL | 1 refills | Status: AC

## 2020-12-16 ENCOUNTER — Other Ambulatory Visit: Payer: Medicare Other

## 2020-12-16 LAB — HM DIABETES FOOT EXAM

## 2021-02-14 MED ORDER — POTASSIUM CHLORIDE CRYS ER 20 MEQ PO TBCR: 20.0000 meq | EXTENDED_RELEASE_TABLET | Freq: Every day | ORAL | 1 refills | Status: DC

## 2021-02-14 NOTE — Addendum Note (Signed)
Addended by: Hulan Fray on: 02/14/2021 12:57 PM   Modules accepted: Orders

## 2021-02-14 NOTE — Telephone Encounter (Signed)
Medication has been refilled.

## 2021-02-14 NOTE — Telephone Encounter (Signed)
Patient is calling in to request a refill on her potassium chloride SA (KLOR-CON) 20 MEQ tablet.Please send to the [facility].

## 2021-03-14 ENCOUNTER — Telehealth: Payer: Self-pay | Admitting: Family Medicine

## 2021-03-14 NOTE — Telephone Encounter (Signed)
Pt daughter in law Amy Fulco called in requesting for Dr. Birdie Sons to fill out [facility] Forms for nursing facility for Pt. Pt daughter in law is requesting a callback

## 2021-03-21 ENCOUNTER — Ambulatory Visit: Payer: Medicare Other | Admitting: Family Medicine

## 2021-03-22 NOTE — Telephone Encounter (Signed)
I called and LVM for the patients daughter to call back and let me know if she wanted it faxed to her or was she picking it up, I also informed her that the provider is out today and tomorrow.  Haru Shaff,cma

## 2021-03-22 NOTE — Telephone Encounter (Signed)
Patient's daughter Amy called and wanted to know the statues of the FL2. It can be a generic FL2. Please call her at [phone].

## 2021-03-24 ENCOUNTER — Telehealth: Payer: Self-pay

## 2021-03-24 DIAGNOSIS — I5031 Acute diastolic (congestive) heart failure: Secondary | ICD-10-CM

## 2021-03-24 DIAGNOSIS — I10 Essential (primary) hypertension: Secondary | ICD-10-CM

## 2021-03-24 MED ORDER — METOPROLOL SUCCINATE ER 50 MG PO TB24: ORAL_TABLET | ORAL | 1 refills | Status: AC

## 2021-03-24 MED ORDER — POTASSIUM CHLORIDE CRYS ER 20 MEQ PO TBCR: 20.0000 meq | EXTENDED_RELEASE_TABLET | Freq: Every day | ORAL | 1 refills | Status: AC

## 2021-03-24 MED ORDER — FUROSEMIDE 40 MG PO TABS: 40.0000 mg | ORAL_TABLET | Freq: Every day | ORAL | 1 refills | Status: AC

## 2021-03-24 NOTE — Telephone Encounter (Signed)
Patient is also requesting a refill on metropolol Succinate/ Also requesting Furosemide 40 mg and A request for Potassium was sent today I did not refill I don't know if she needs labs or is this a continuous medication for her, please advise.    Last OV: 09/15/2020  Future OV; 06/22/2021  Ines Warf,cma

## 2021-03-24 NOTE — Telephone Encounter (Signed)
Refills sent to pharmacy. 

## 2021-04-11 NOTE — Telephone Encounter (Signed)
Noted. Agree with the need to have a visit to complete this. I advised she could be scheduled on 12/23.

## 2021-04-11 NOTE — Telephone Encounter (Signed)
Patient daughter called and I tried calling her back and left VM, she came in to the clinic and stated someone called and said it was ready for pickup, I called to speak to her that's why I requested her to call back, the provider has not seen the patient since 08/2020 and I informed the front staff that she would need a appointment to physically see the provider to get a FL2 filled out. The front staff relayed this message to the daughter   Edris Schneck,cma

## 2021-04-15 ENCOUNTER — Ambulatory Visit: Payer: Medicare Other | Admitting: Family Medicine

## 2021-05-09 DIAGNOSIS — Z9981 Dependence on supplemental oxygen: Secondary | ICD-10-CM | POA: Diagnosis not present

## 2021-05-09 DIAGNOSIS — I110 Hypertensive heart disease with heart failure: Secondary | ICD-10-CM | POA: Diagnosis not present

## 2021-05-09 DIAGNOSIS — Z7901 Long term (current) use of anticoagulants: Secondary | ICD-10-CM | POA: Diagnosis not present

## 2021-05-18 ENCOUNTER — Ambulatory Visit (INDEPENDENT_AMBULATORY_CARE_PROVIDER_SITE_OTHER): Payer: Medicare Other

## 2021-05-18 VITALS — Ht 68.0 in | Wt 325.0 lb

## 2021-05-18 DIAGNOSIS — Z0000 Encounter for general adult medical examination without abnormal findings: Secondary | ICD-10-CM | POA: Diagnosis not present

## 2021-05-18 NOTE — Patient Instructions (Addendum)
Ms. Kathleen Barton , Thank you for taking time to come for your Medicare Wellness Visit. I appreciate your ongoing commitment to your health goals. Please review the following plan we discussed and let me know if I can assist you in the future.   These are the goals we discussed:  Goals      Increase physical activity     Walk for exercise as tolerated        This is a list of the screening recommended for you and due dates:  Health Maintenance  Topic Date Due   Flu Shot  07/22/2021*   Zoster (Shingles) Vaccine (1 of 2) 08/16/2021*   Pneumonia Vaccine (2 - PPSV23 if available, else PCV20) 05/18/2022*   DEXA scan (bone density measurement)  05/18/2022*   Tetanus Vaccine  05/18/2022*   HPV Vaccine  Aged Out   COVID-19 Vaccine  Discontinued  *Topic was postponed. The date shown is not the original due date.    Advanced directives: not yet filed  Conditions/risks identified: none new  Next appointment: Follow up in one year for your annual wellness visit    Preventive Care 65 Years and Older, Female Preventive care refers to lifestyle choices and visits with your health care provider that can promote health and wellness. What does preventive care include? A yearly physical exam. This is also called an annual well check. Dental exams once or twice a year. Routine eye exams. Ask your health care provider how often you should have your eyes checked. Personal lifestyle choices, including: Daily care of your teeth and gums. Regular physical activity. Eating a healthy diet. Avoiding tobacco and drug use. Limiting alcohol use. Practicing safe sex. Taking low-dose aspirin every day. Taking vitamin and mineral supplements as recommended by your health care provider. What happens during an annual well check? The services and screenings done by your health care provider during your annual well check will depend on your age, overall health, lifestyle risk factors, and family history of  disease. Counseling  Your health care provider may ask you questions about your: Alcohol use. Tobacco use. Drug use. Emotional well-being. Home and relationship well-being. Sexual activity. Eating habits. History of falls. Memory and ability to understand (cognition). Work and work Astronomer. Reproductive health. Screening  You may have the following tests or measurements: Height, weight, and BMI. Blood pressure. Lipid and cholesterol levels. These may be checked every 5 years, or more frequently if you are over 55 years old. Skin check. Lung cancer screening. You may have this screening every year starting at age 30 if you have a 30-pack-year history of smoking and currently smoke or have quit within the past 15 years. Fecal occult blood test (FOBT) of the stool. You may have this test every year starting at age 57. Flexible sigmoidoscopy or colonoscopy. You may have a sigmoidoscopy every 5 years or a colonoscopy every 10 years starting at age 76. Hepatitis C blood test. Hepatitis B blood test. Sexually transmitted disease (STD) testing. Diabetes screening. This is done by checking your blood sugar (glucose) after you have not eaten for a while (fasting). You may have this done every 1-3 years. Bone density scan. This is done to screen for osteoporosis. You may have this done starting at age 20. Mammogram. This may be done every 1-2 years. Talk to your health care provider about how often you should have regular mammograms. Talk with your health care provider about your test results, treatment options, and if necessary, the need for  more tests. Vaccines  Your health care provider may recommend certain vaccines, such as: Influenza vaccine. This is recommended every year. Tetanus, diphtheria, and acellular pertussis (Tdap, Td) vaccine. You may need a Td booster every 10 years. Zoster vaccine. You may need this after age 44. Pneumococcal 13-valent conjugate (PCV13) vaccine. One  dose is recommended after age 21. Pneumococcal polysaccharide (PPSV23) vaccine. One dose is recommended after age 101. Talk to your health care provider about which screenings and vaccines you need and how often you need them. This information is not intended to replace advice given to you by your health care provider. Make sure you discuss any questions you have with your health care provider. Document Released: 05/07/2015 Document Revised: 12/29/2015 Document Reviewed: 02/09/2015 Elsevier Interactive Patient Education  2017 [place] Prevention in the Home Falls can cause injuries. They can happen to people of all ages. There are many things you can do to make your home safe and to help prevent falls. What can I do on the outside of my home? Regularly fix the edges of walkways and driveways and fix any cracks. Remove anything that might make you trip as you walk through a door, such as a raised step or threshold. Trim any bushes or trees on the path to your home. Use bright outdoor lighting. Clear any walking paths of anything that might make someone trip, such as rocks or tools. Regularly check to see if handrails are loose or broken. Make sure that both sides of any steps have handrails. Any raised decks and porches should have guardrails on the edges. Have any leaves, snow, or ice cleared regularly. Use sand or salt on walking paths during winter. Clean up any spills in your garage right away. This includes oil or grease spills. What can I do in the bathroom? Use night lights. Install grab bars by the toilet and in the tub and shower. Do not use towel bars as grab bars. Use non-skid mats or decals in the tub or shower. If you need to sit down in the shower, use a plastic, non-slip stool. Keep the floor dry. Clean up any water that spills on the floor as soon as it happens. Remove soap buildup in the tub or shower regularly. Attach bath mats securely with double-sided  non-slip rug tape. Do not have throw rugs and other things on the floor that can make you trip. What can I do in the bedroom? Use night lights. Make sure that you have a light by your bed that is easy to reach. Do not use any sheets or blankets that are too big for your bed. They should not hang down onto the floor. Have a firm chair that has side arms. You can use this for support while you get dressed. Do not have throw rugs and other things on the floor that can make you trip. What can I do in the kitchen? Clean up any spills right away. Avoid walking on wet floors. Keep items that you use a lot in easy-to-reach places. If you need to reach something above you, use a strong step stool that has a grab bar. Keep electrical cords out of the way. Do not use floor polish or wax that makes floors slippery. If you must use wax, use non-skid floor wax. Do not have throw rugs and other things on the floor that can make you trip. What can I do with my stairs? Do not leave any items on the stairs. Make  sure that there are handrails on both sides of the stairs and use them. Fix handrails that are broken or loose. Make sure that handrails are as long as the stairways. Check any carpeting to make sure that it is firmly attached to the stairs. Fix any carpet that is loose or worn. Avoid having throw rugs at the top or bottom of the stairs. If you do have throw rugs, attach them to the floor with carpet tape. Make sure that you have a light switch at the top of the stairs and the bottom of the stairs. If you do not have them, ask someone to add them for you. What else can I do to help prevent falls? Wear shoes that: Do not have high heels. Have rubber bottoms. Are comfortable and fit you well. Are closed at the toe. Do not wear sandals. If you use a stepladder: Make sure that it is fully opened. Do not climb a closed stepladder. Make sure that both sides of the stepladder are locked into place. Ask  someone to hold it for you, if possible. Clearly mark and make sure that you can see: Any grab bars or handrails. First and last steps. Where the edge of each step is. Use tools that help you move around (mobility aids) if they are needed. These include: Canes. Walkers. Scooters. Crutches. Turn on the lights when you go into a dark area. Replace any light bulbs as soon as they burn out. Set up your furniture so you have a clear path. Avoid moving your furniture around. If any of your floors are uneven, fix them. If there are any pets around you, be aware of where they are. Review your medicines with your doctor. Some medicines can make you feel dizzy. This can increase your chance of falling. Ask your doctor what other things that you can do to help prevent falls. This information is not intended to replace advice given to you by your health care provider. Make sure you discuss any questions you have with your health care provider. Document Released: 02/04/2009 Document Revised: 09/16/2015 Document Reviewed: 05/15/2014 Elsevier Interactive Patient Education  2017 [employer].

## 2021-05-18 NOTE — Progress Notes (Signed)
Subjective:   Kathleen Barton is a 81 y.o. female who presents for Medicare Annual (Subsequent) preventive examination.  Review of Systems    No ROS.  Medicare Wellness Virtual Visit.  Visual/audio telehealth visit, UTA vital signs.   See social history for additional risk factors.   Cardiac Risk Factors include: advanced age (>15men, >44 women);hypertension     Objective:    Today's Vitals   05/18/21 1533  Weight: (!) 325 lb (147.4 kg)  Height: 5\' 8"  (1.727 m)   Body mass index is 49.42 kg/m.  Advanced Directives 05/18/2021 05/17/2020 11/24/2019 05/15/2019 09/08/2016 08/27/2015  Does Patient Have a Medical Advance Directive? No No Unable to assess, patient is non-responsive or altered mental status Yes No No  Type of Advance Directive - - - [place];Living will - -  Does patient want to make changes to medical advance directive? - - - No - Patient declined - -  Copy of [place] in Chart? - - - No - copy requested - -  Would patient like information on creating a medical advance directive? No - Patient declined No - Patient declined - - Yes (MAU/Ambulatory/Procedural Areas - Information given) No - patient declined information    Current Medications (verified) Outpatient Encounter Medications as of 05/18/2021  Medication Sig   apixaban (ELIQUIS) 5 MG TABS tablet Take 1 tablet (5 mg total) by mouth 2 (two) times daily.   cholecalciferol (VITAMIN D) 1000 units tablet Take 1,000 Units by mouth daily.   cyanocobalamin 1000 MCG tablet Take 1,000 mcg by mouth daily.   furosemide (LASIX) 40 MG tablet Take 1 tablet (40 mg total) by mouth daily.   ipratropium-albuterol (DUONEB) 0.5-2.5 (3) MG/3ML SOLN Take 3 mLs by nebulization 2 (two) times daily.   meclizine (ANTIVERT) 25 MG tablet Take 25 mg by mouth 3 (three) times daily as needed for dizziness.   metoprolol succinate (TOPROL-XL) 50 MG 24 hr tablet TAKE ONE TABLET BY MOUTH ONCE DAILY TAKE WITH OR  IMMEDIATELY FOLLOWING A MEAL   naphazoline-glycerin (CLEAR EYES REDNESS) 0.012-0.2 % SOLN Place 1-2 drops into both eyes 4 (four) times daily as needed for eye irritation.   potassium chloride SA (KLOR-CON M) 20 MEQ tablet Take 1 tablet (20 mEq total) by mouth daily.   sertraline (ZOLOFT) 50 MG tablet Take 1 tablet (50 mg total) by mouth daily.   No facility-administered encounter medications on file as of 05/18/2021.    Allergies (verified) Patient has no known allergies.   History: Past Medical History:  Diagnosis Date   Hypertension    Urinary incontinence    Past Surgical History:  Procedure Laterality Date   ABDOMINAL HYSTERECTOMY  1974   Family History  Problem Relation Age of Onset   Heart disease Other    Hypertension Other    Social History   Socioeconomic History   Marital status: Single    Spouse name: Not on file   Number of children: Not on file   Years of education: Not on file   Highest education level: Not on file  Occupational History   Not on file  Tobacco Use   Smoking status: Never   Smokeless tobacco: Never  Substance and Sexual Activity   Alcohol use: No    Alcohol/week: 0.0 standard drinks   Drug use: No   Sexual activity: Never  Other Topics Concern   Not on file  Social History Narrative   Not on file   Social  Determinants of Health   Financial Resource Strain: Low Risk    Difficulty of Paying Living Expenses: Not hard at all  Food Insecurity: No Food Insecurity   Worried About [place] in the Last Year: Never true   Ran Out of Food in the Last Year: Never true  Transportation Needs: No Transportation Needs   Lack of Transportation (Medical): No   Lack of Transportation (Non-Medical): No  Physical Activity: Unknown   Days of Exercise per Week: 0 days   Minutes of Exercise per Session: Not on file  Stress: No Stress Concern Present   Feeling of Stress : Not at all  Social Connections: Unknown   Frequency of  Communication with Friends and Family: More than three times a week   Frequency of Social Gatherings with Friends and Family: More than three times a week   Attends Religious Services: Never   Marine scientist or Organizations: Yes   Attends Archivist Meetings: Never   Marital Status: Not on file    Tobacco Counseling Counseling given: Not Answered   Clinical Intake:  Pre-visit preparation completed: Yes        Diabetes: No  How often do you need to have someone help you when you read instructions, pamphlets, or other written materials from your doctor or pharmacy?: 1 - Never    Interpreter Needed?: No      Activities of Daily Living In your present state of health, do you have any difficulty performing the following activities: 05/18/2021  Hearing? N  Vision? N  Difficulty concentrating or making decisions? N  Walking or climbing stairs? Y  Dressing or bathing? Y  Doing errands, shopping? Y  Preparing Food and eating ? N  Using the Toilet? N  In the past six months, have you accidently leaked urine? Y  Comment Managed with pad  Do you have problems with loss of bowel control? N  Managing your Medications? N  Managing your Finances? N  Housekeeping or managing your Housekeeping? Y  Comment Aide assist  Some recent data might be hidden    Patient Care Team: Leone Haven, MD as PCP - General (Family Medicine)  Indicate any recent Medical Services you may have received from other than [facility] providers in the past year (date may be approximate).     Assessment:   This is a routine wellness examination for Kathleen Barton.  Virtual Visit via Telephone Note  I connected with  AISATOU PENNOYER on 05/18/21 at  3:30 PM EST by telephone and verified that I am speaking with the correct person using two identifiers.  Persons participating in the virtual visit: patient/Nurse Health Advisor   I discussed the limitations, risks, security and privacy concerns  of performing an evaluation and management service by telephone and the availability of in person appointments. The patient expressed understanding and agreed to proceed.  Interactive audio and video telecommunications were attempted between this nurse and patient, however failed, due to patient having technical difficulties OR patient did not have access to video capability.  We continued and completed visit with audio only.  Some vital signs may be absent or patient reported.   Hearing/Vision screen Hearing Screening - Comments:: Patient is able to hear conversational tones without difficulty.  No issues reported. Vision Screening - Comments:: Followed by [facility] Wears corrective lenses They have regular follow up with the ophthalmologist  Dietary issues and exercise activities discussed: Current Exercise Habits: Home exercise routine,  Type of exercise: walking, Intensity: Mild Regular diet   Goals Addressed   None    Depression Screen PHQ 2/9 Scores 05/18/2021 09/15/2020 05/17/2020 01/20/2020 05/15/2019 03/31/2019 11/22/2018  PHQ - 2 Score 0 0 0 0 0 0 0  PHQ- 9 Score - - - - - - -    Fall Risk Fall Risk  05/18/2021 09/15/2020 05/17/2020 01/20/2020 05/15/2019  Falls in the past year? 0 0 0 0 0  Number falls in past yr: 0 0 0 0 -  Injury with Fall? - - 0 - -  Follow up Falls evaluation completed Falls evaluation completed Falls evaluation completed Falls evaluation completed Falls evaluation completed    [place]: Home free of loose throw rugs in walkways, pet beds, electrical cords, etc? Yes  Adequate lighting in your home to reduce risk of falls? Yes   ASSISTIVE DEVICES UTILIZED TO PREVENT FALLS: Life alert? No  Use of a cane, walker or w/c? Yes  Grab bars in the bathroom? Yes  Shower chair or bench in shower? No  Elevated toilet seat or a handicapped toilet? No   TIMED UP AND GO: Was the test performed? No .   Cognitive  Function: MMSE - Mini Mental State Exam 09/08/2016  Orientation to time 5  Orientation to Place 5  Registration 3  Attention/ Calculation 5  Recall 3  Language- name 2 objects 2  Language- repeat 1  Language- follow 3 step command 3  Language- read & follow direction 1  Write a sentence 1  Copy design 1  Total score 30     6CIT Screen 05/17/2020 05/15/2019  What Year? 0 points 0 points  What month? 0 points 0 points  What time? 0 points 0 points  Count back from 20 0 points 0 points  Months in reverse 0 points 0 points  Repeat phrase 0 points -  Total Score 0 -    Immunizations Immunization History  Administered Date(s) Administered   Influenza-Unspecified 02/22/2018   Pneumococcal Conjugate-13 09/08/2016   TDAP status: Due, Education has been provided regarding the importance of this vaccine. Advised may receive this vaccine at local pharmacy or Health Dept. Aware to provide a copy of the vaccination record if obtained from local pharmacy or Health Dept. Verbalized acceptance and understanding. Deferred.   Flu Vaccine status: Declined, Education has been provided regarding the importance of this vaccine but patient still declined. Advised may receive this vaccine at local pharmacy or Health Dept. Aware to provide a copy of the vaccination record if obtained from local pharmacy or Health Dept. Verbalized acceptance and understanding.  Pneumococcal vaccine status: Declined,  Education has been provided regarding the importance of this vaccine but patient still declined. Advised may receive this vaccine at local pharmacy or Health Dept. Aware to provide a copy of the vaccination record if obtained from local pharmacy or Health Dept. Verbalized acceptance and understanding.   Covid-19 vaccine status: Declined, Education has been provided regarding the importance of this vaccine but patient still declined. Advised may receive this vaccine at local pharmacy or Health Dept.or vaccine  clinic. Aware to provide a copy of the vaccination record if obtained from local pharmacy or Health Dept. Verbalized acceptance and understanding.  Shingrix Completed?: No.    Education has been provided regarding the importance of this vaccine. Patient has been advised to call insurance company to determine out of pocket expense if they have not yet received this vaccine. Advised may  also receive vaccine at local pharmacy or Health Dept. Verbalized acceptance and understanding.  Screening Tests Health Maintenance  Topic Date Due   INFLUENZA VACCINE  07/22/2021 (Originally 11/22/2020)   Zoster Vaccines- Shingrix (1 of 2) 08/16/2021 (Originally 07/31/1989)   Pneumonia Vaccine 68+ Years old (2 - PPSV23 if available, else PCV20) 05/18/2022 (Originally 09/08/2017)   DEXA SCAN  05/18/2022 (Originally 07/31/2004)   TETANUS/TDAP  05/18/2022 (Originally 08/01/1959)   HPV VACCINES  Aged Out   COVID-19 Vaccine  Discontinued   Health Maintenance There are no preventive care reminders to display for this patient.  Dexa Scan- declined per patient.   Lung Cancer Screening: (Low Dose CT Chest recommended if Age 71-80 years, 30 pack-year currently smoking OR have quit w/in 15years.) does not qualify.   Hepatitis C Screening: does not qualify  Vision Screening: Recommended annual ophthalmology exams for early detection of glaucoma and other disorders of the eye.  Dental Screening: Recommended annual dental exams for proper oral hygiene  Community Resource Referral / Chronic Care Management: CRR required this visit?  No   CCM required this visit?  No      Plan:   Keep all routine maintenance appointments.   I have personally reviewed and noted the following in the patients chart:   Medical and social history Use of alcohol, tobacco or illicit drugs  Current medications and supplements including opioid prescriptions. Not taking opioid.  Functional ability and status Nutritional status Physical  activity Advanced directives List of other physicians Hospitalizations, surgeries, and ER visits in previous 12 months Vitals Screenings to include cognitive, depression, and falls Referrals and appointments  In addition, I have reviewed and discussed with patient certain preventive protocols, quality metrics, and best practice recommendations. A written personalized care plan for preventive services as well as general preventive health recommendations were provided to patient.     Varney Biles, LPN   [phone]

## 2021-06-06 DIAGNOSIS — G4733 Obstructive sleep apnea (adult) (pediatric): Secondary | ICD-10-CM | POA: Diagnosis not present

## 2021-06-06 DIAGNOSIS — E538 Deficiency of other specified B group vitamins: Secondary | ICD-10-CM | POA: Diagnosis not present

## 2021-06-06 DIAGNOSIS — I10 Essential (primary) hypertension: Secondary | ICD-10-CM | POA: Diagnosis not present

## 2021-06-06 DIAGNOSIS — I110 Hypertensive heart disease with heart failure: Secondary | ICD-10-CM | POA: Diagnosis not present

## 2021-06-06 DIAGNOSIS — I5032 Chronic diastolic (congestive) heart failure: Secondary | ICD-10-CM | POA: Diagnosis not present

## 2021-06-06 DIAGNOSIS — Z9981 Dependence on supplemental oxygen: Secondary | ICD-10-CM | POA: Diagnosis not present

## 2021-06-06 DIAGNOSIS — Z7901 Long term (current) use of anticoagulants: Secondary | ICD-10-CM | POA: Diagnosis not present

## 2021-06-06 DIAGNOSIS — R54 Age-related physical debility: Secondary | ICD-10-CM | POA: Diagnosis not present

## 2021-06-06 DIAGNOSIS — F32A Depression, unspecified: Secondary | ICD-10-CM | POA: Diagnosis not present

## 2021-06-06 DIAGNOSIS — R531 Weakness: Secondary | ICD-10-CM | POA: Diagnosis not present

## 2021-06-06 DIAGNOSIS — Z86718 Personal history of other venous thrombosis and embolism: Secondary | ICD-10-CM | POA: Diagnosis not present

## 2021-06-15 DIAGNOSIS — E538 Deficiency of other specified B group vitamins: Secondary | ICD-10-CM | POA: Diagnosis not present

## 2021-06-15 DIAGNOSIS — J9610 Chronic respiratory failure, unspecified whether with hypoxia or hypercapnia: Secondary | ICD-10-CM | POA: Diagnosis not present

## 2021-06-15 DIAGNOSIS — I480 Paroxysmal atrial fibrillation: Secondary | ICD-10-CM | POA: Diagnosis not present

## 2021-06-15 DIAGNOSIS — I10 Essential (primary) hypertension: Secondary | ICD-10-CM | POA: Diagnosis not present

## 2021-06-15 DIAGNOSIS — R079 Chest pain, unspecified: Secondary | ICD-10-CM | POA: Diagnosis not present

## 2021-06-15 DIAGNOSIS — N189 Chronic kidney disease, unspecified: Secondary | ICD-10-CM | POA: Diagnosis not present

## 2021-06-16 DIAGNOSIS — I480 Paroxysmal atrial fibrillation: Secondary | ICD-10-CM | POA: Diagnosis not present

## 2021-06-16 DIAGNOSIS — D649 Anemia, unspecified: Secondary | ICD-10-CM | POA: Diagnosis not present

## 2021-06-16 DIAGNOSIS — R002 Palpitations: Secondary | ICD-10-CM | POA: Diagnosis not present

## 2021-06-16 DIAGNOSIS — R918 Other nonspecific abnormal finding of lung field: Secondary | ICD-10-CM | POA: Diagnosis not present

## 2021-06-16 DIAGNOSIS — I509 Heart failure, unspecified: Secondary | ICD-10-CM | POA: Diagnosis not present

## 2021-06-20 DIAGNOSIS — I130 Hypertensive heart and chronic kidney disease with heart failure and stage 1 through stage 4 chronic kidney disease, or unspecified chronic kidney disease: Secondary | ICD-10-CM | POA: Diagnosis not present

## 2021-06-20 DIAGNOSIS — F32A Depression, unspecified: Secondary | ICD-10-CM | POA: Diagnosis not present

## 2021-06-21 DIAGNOSIS — I480 Paroxysmal atrial fibrillation: Secondary | ICD-10-CM | POA: Diagnosis not present

## 2021-06-21 DIAGNOSIS — J9610 Chronic respiratory failure, unspecified whether with hypoxia or hypercapnia: Secondary | ICD-10-CM | POA: Diagnosis not present

## 2021-06-22 ENCOUNTER — Ambulatory Visit: Payer: Medicare Other | Admitting: Family Medicine

## 2022-03-03 ENCOUNTER — Other Ambulatory Visit: Payer: Self-pay | Admitting: *Deleted

## 2022-06-23 DEATH — deceased

## 2022-12-15 ENCOUNTER — Telehealth: Payer: Self-pay | Admitting: Family Medicine

## 2022-12-15 NOTE — Telephone Encounter (Signed)
Patient last seen in 2022- unable to leave voicemail for patient to schedule annual physical or see if they have established care somewhere else
